# Patient Record
Sex: Female | Born: 1960 | Race: White | Hispanic: No | Marital: Married | Smoking: Never smoker
Health system: Southern US, Community
[De-identification: ages and names within clinical notes are randomized; demographics above are authoritative.]

## PROBLEM LIST (undated history)

## (undated) DIAGNOSIS — T7840XA Allergy, unspecified, initial encounter: Secondary | ICD-10-CM

## (undated) DIAGNOSIS — E079 Disorder of thyroid, unspecified: Secondary | ICD-10-CM

## (undated) DIAGNOSIS — J45909 Unspecified asthma, uncomplicated: Secondary | ICD-10-CM

## (undated) HISTORY — DX: Allergy, unspecified, initial encounter: T78.40XA

## (undated) HISTORY — PX: ABDOMINAL HYSTERECTOMY: SHX81

## (undated) HISTORY — DX: Disorder of thyroid, unspecified: E07.9

## (undated) HISTORY — DX: Unspecified asthma, uncomplicated: J45.909

---

## 1998-02-15 ENCOUNTER — Inpatient Hospital Stay (HOSPITAL_COMMUNITY): Admission: RE | Admit: 1998-02-15 | Discharge: 1998-02-16 | Payer: Self-pay | Admitting: Gynecology

## 1998-08-13 ENCOUNTER — Ambulatory Visit (HOSPITAL_COMMUNITY): Admission: RE | Admit: 1998-08-13 | Discharge: 1998-08-13 | Payer: Self-pay | Admitting: Family Medicine

## 1998-08-13 ENCOUNTER — Encounter: Payer: Self-pay | Admitting: Family Medicine

## 1999-07-26 ENCOUNTER — Other Ambulatory Visit: Admission: RE | Admit: 1999-07-26 | Discharge: 1999-07-26 | Payer: Self-pay | Admitting: Gynecology

## 2000-10-10 ENCOUNTER — Other Ambulatory Visit: Admission: RE | Admit: 2000-10-10 | Discharge: 2000-10-10 | Payer: Self-pay | Admitting: Gynecology

## 2003-01-26 ENCOUNTER — Other Ambulatory Visit: Admission: RE | Admit: 2003-01-26 | Discharge: 2003-01-26 | Payer: Self-pay | Admitting: Gynecology

## 2004-08-08 ENCOUNTER — Other Ambulatory Visit: Admission: RE | Admit: 2004-08-08 | Discharge: 2004-08-08 | Payer: Self-pay | Admitting: Gynecology

## 2016-04-02 ENCOUNTER — Ambulatory Visit (INDEPENDENT_AMBULATORY_CARE_PROVIDER_SITE_OTHER): Payer: Self-pay | Admitting: Emergency Medicine

## 2016-04-02 VITALS — BP 125/85 | HR 92 | Temp 98.6°F | Ht 66.0 in | Wt 225.8 lb

## 2016-04-02 DIAGNOSIS — R059 Cough, unspecified: Secondary | ICD-10-CM

## 2016-04-02 DIAGNOSIS — J209 Acute bronchitis, unspecified: Secondary | ICD-10-CM

## 2016-04-02 DIAGNOSIS — R05 Cough: Secondary | ICD-10-CM

## 2016-04-02 MED ORDER — AZITHROMYCIN 250 MG PO TABS: ORAL_TABLET | ORAL | 0 refills | Status: DC

## 2016-04-02 MED ORDER — PROMETHAZINE-CODEINE 6.25-10 MG/5ML PO SYRP: 5.0000 mL | ORAL_SOLUTION | Freq: Every evening | ORAL | 0 refills | Status: DC | PRN

## 2016-04-02 NOTE — Patient Instructions (Addendum)
     IF you received an x-ray today, you will receive an invoice from [facility]. Please contact [facility] at [phone] with questions or concerns regarding your invoice.   IF you received labwork today, you will receive an invoice from [facility]. Please contact [facility] at [phone] with questions or concerns regarding your invoice.   Our billing staff will not be able to assist you with questions regarding bills from these companies.  You will be contacted with the lab results as soon as they are available. The fastest way to get your results is to activate your My Chart account. Instructions are located on the last page of this paperwork. If you have not heard from us regarding the results in 2 weeks, please contact this office.      Acute Bronchitis, Adult Acute bronchitis is when air tubes (bronchi) in the lungs suddenly get swollen. The condition can make it hard to breathe. It can also cause these symptoms:  A cough.  Coughing up clear, yellow, or green mucus.  Wheezing.  Chest congestion.  Shortness of breath.  A fever.  Body aches.  Chills.  A sore throat.  Follow these instructions at home: Medicines  Take over-the-counter and prescription medicines only as told by your doctor.  If you were prescribed an antibiotic medicine, take it as told by your doctor. Do not stop taking the antibiotic even if you start to feel better. General instructions  Rest.  Drink enough fluids to keep your pee (urine) clear or pale yellow.  Avoid smoking and secondhand smoke. If you smoke and you need help quitting, ask your doctor. Quitting will help your lungs heal faster.  Use an inhaler, cool mist vaporizer, or humidifier as told by your doctor.  Keep all follow-up visits as told by your doctor. This is important. How is this prevented? To lower your risk of getting this condition again:  Wash your hands often with soap and water. If you cannot  use soap and water, use hand sanitizer.  Avoid contact with people who have cold symptoms.  Try not to touch your hands to your mouth, nose, or eyes.  Make sure to get the flu shot every year.  Contact a doctor if:  Your symptoms do not get better in 2 weeks. Get help right away if:  You cough up blood.  You have chest pain.  You have very bad shortness of breath.  You become dehydrated.  You faint (pass out) or keep feeling like you are going to pass out.  You keep throwing up (vomiting).  You have a very bad headache.  Your fever or chills gets worse. This information is not intended to replace advice given to you by your health care provider. Make sure you discuss any questions you have with your health care provider. Document Released: 06/20/2007 Document Revised: 08/10/2015 Document Reviewed: 06/22/2015 Elsevier Interactive Patient Education  2017 Elsevier Inc.  

## 2016-04-02 NOTE — Progress Notes (Signed)
Stacey Black 56 y.o.   Chief Complaint  Patient presents with  . Sinus Problem    X 1 mth   . Cough    X 4 weeks    HISTORY OF PRESENT ILLNESS: This is a 56 y.o. female complaining of 4 week h/o cough now bringing up green phlegm.  Cough  This is a new problem. The current episode started 1 to 4 weeks ago. The problem has been gradually worsening. The problem occurs every few minutes. The cough is productive of purulent sputum. Associated symptoms include myalgias. Pertinent negatives include no chest pain, chills, ear congestion, ear pain, eye redness, fever, headaches, heartburn, hemoptysis, nasal congestion, rash, rhinorrhea, sore throat or shortness of breath. Her past medical history is significant for asthma.     Prior to Admission medications   Not on File    Allergies  Allergen Reactions  . Penicillins Swelling    There are no active problems to display for this patient.   Past Medical History:  Diagnosis Date  . Allergy   . Asthma    as a child  . Thyroid disease     Past Surgical History:  Procedure Laterality Date  . ABDOMINAL HYSTERECTOMY     2000    Social History   Social History  . Marital status: Married    Spouse name: N/A  . Number of children: N/A  . Years of education: N/A   Occupational History  . Not on file.   Social History Main Topics  . Smoking status: Never Smoker  . Smokeless tobacco: Never Used  . Alcohol use 1.8 oz/week    1 Glasses of wine, 1 Cans of beer, 1 Shots of liquor per week  . Drug use: No  . Sexual activity: Not on file   Other Topics Concern  . Not on file   Social History Narrative  . No narrative on file    Family History  Problem Relation Age of Onset  . Arthritis Mother   . Asthma Mother   . Vision loss Mother      Review of Systems  Constitutional: Positive for malaise/fatigue. Negative for chills and fever.  HENT: Positive for congestion. Negative for ear discharge, ear pain,  nosebleeds, rhinorrhea and sore throat.   Eyes: Negative for discharge and redness.  Respiratory: Positive for cough. Negative for hemoptysis and shortness of breath.   Cardiovascular: Negative for chest pain, palpitations and leg swelling.  Gastrointestinal: Negative for abdominal pain, diarrhea, heartburn, nausea and vomiting.  Genitourinary: Negative for dysuria and hematuria.  Musculoskeletal: Positive for myalgias. Negative for back pain and neck pain.  Skin: Negative for rash.  Neurological: Negative for dizziness, sensory change, focal weakness and headaches.  Endo/Heme/Allergies: Negative.   All other systems reviewed and are negative.  Vitals:   04/02/16 1527  BP: 125/85  Pulse: 92  Temp: 98.6 F (37 C)     Physical Exam  Constitutional: She is oriented to person, place, and time. She appears well-developed and well-nourished.  HENT:  Head: Normocephalic and atraumatic.  Right Ear: External ear normal.  Left Ear: External ear normal.  Nose: Nose normal.  Mouth/Throat: Oropharynx is clear and moist.  Eyes: Conjunctivae and EOM are normal. Pupils are equal, round, and reactive to light.  Neck: Normal range of motion. No JVD present. No thyromegaly present.  Cardiovascular: Normal rate, regular rhythm and normal heart sounds.   No murmur heard. Pulmonary/Chest: Effort normal and breath sounds normal. She has no  wheezes. She has no rales.  Abdominal: Soft. She exhibits no distension. There is no tenderness.  Musculoskeletal: Normal range of motion.  Lymphadenopathy:    She has no cervical adenopathy.  Neurological: She is alert and oriented to person, place, and time. No sensory deficit. She exhibits normal muscle tone.  Skin: Skin is warm and dry. Capillary refill takes less than 2 seconds. No rash noted.  Psychiatric: She has a normal mood and affect.  Vitals reviewed.    ASSESSMENT & PLAN: Stacey Black was seen today for sinus problem and cough.  Diagnoses and all  orders for this visit:  Acute bronchitis, unspecified organism  Cough  Other orders -     azithromycin (ZITHROMAX) 250 MG tablet; Sig as indicated -     promethazine-codeine (PHENERGAN WITH CODEINE) 6.25-10 MG/5ML syrup; Take 5 mLs by mouth at bedtime as needed for cough.    Patient Instructions       IF you received an x-ray today, you will receive an invoice from [facility]. Please contact [facility] at [phone] with questions or concerns regarding your invoice.   IF you received labwork today, you will receive an invoice from [facility]. Please contact [facility] at [phone] with questions or concerns regarding your invoice.   Our billing staff will not be able to assist you with questions regarding bills from these companies.  You will be contacted with the lab results as soon as they are available. The fastest way to get your results is to activate your My Chart account. Instructions are located on the last page of this paperwork. If you have not heard from [place] regarding the results in 2 weeks, please contact this office.      Acute Bronchitis, Adult Acute bronchitis is when air tubes (bronchi) in the lungs suddenly get swollen. The condition can make it hard to breathe. It can also cause these symptoms:  A cough.  Coughing up clear, yellow, or green mucus.  Wheezing.  Chest congestion.  Shortness of breath.  A fever.  Body aches.  Chills.  A sore throat. Follow these instructions at home: Medicines   Take over-the-counter and prescription medicines only as told by your doctor.  If you were prescribed an antibiotic medicine, take it as told by your doctor. Do not stop taking the antibiotic even if you start to feel better. General instructions   Rest.  Drink enough fluids to keep your pee (urine) clear or pale yellow.  Avoid smoking and secondhand smoke. If you smoke and you need help quitting, ask your doctor. Quitting will  help your lungs heal faster.  Use an inhaler, cool mist vaporizer, or humidifier as told by your doctor.  Keep all follow-up visits as told by your doctor. This is important. How is this prevented? To lower your risk of getting this condition again:  Wash your hands often with soap and water. If you cannot use soap and water, use hand sanitizer.  Avoid contact with people who have cold symptoms.  Try not to touch your hands to your mouth, nose, or eyes.  Make sure to get the flu shot every year. Contact a doctor if:  Your symptoms do not get better in 2 weeks. Get help right away if:  You cough up blood.  You have chest pain.  You have very bad shortness of breath.  You become dehydrated.  You faint (pass out) or keep feeling like you are going to pass out.  You keep throwing up (vomiting).  You have a very bad headache.  Your fever or chills gets worse. This information is not intended to replace advice given to you by your health care provider. Make sure you discuss any questions you have with your health care provider. Document Released: 06/20/2007 Document Revised: 08/10/2015 Document Reviewed: 06/22/2015 Elsevier Interactive Patient Education  2017 Elsevier Inc.      Edwina BarthMiguel Deanndra Kirley, MD [facility]

## 2017-10-04 ENCOUNTER — Ambulatory Visit: Payer: Self-pay | Admitting: Family Medicine

## 2017-10-04 VITALS — BP 110/83 | HR 83 | Temp 98.5°F | Ht 66.0 in | Wt 220.0 lb

## 2017-10-04 DIAGNOSIS — J0110 Acute frontal sinusitis, unspecified: Secondary | ICD-10-CM

## 2017-10-04 DIAGNOSIS — R059 Cough, unspecified: Secondary | ICD-10-CM

## 2017-10-04 DIAGNOSIS — R062 Wheezing: Secondary | ICD-10-CM

## 2017-10-04 DIAGNOSIS — R05 Cough: Secondary | ICD-10-CM

## 2017-10-04 MED ORDER — PREDNISONE 20 MG PO TABS: 40.0000 mg | ORAL_TABLET | Freq: Every day | ORAL | 0 refills | Status: AC

## 2017-10-04 MED ORDER — AZITHROMYCIN 500 MG PO TABS: 500.0000 mg | ORAL_TABLET | Freq: Every day | ORAL | 0 refills | Status: AC

## 2017-10-04 MED ORDER — PSEUDOEPH-BROMPHEN-DM 30-2-10 MG/5ML PO SYRP: 10.0000 mL | ORAL_SOLUTION | Freq: Three times a day (TID) | ORAL | 0 refills | Status: DC | PRN

## 2017-10-04 NOTE — Patient Instructions (Signed)

## 2017-10-04 NOTE — Progress Notes (Signed)
Stacey NearingJane Black Sherrill is a 57 y.o. female who presents today with concerns of cough and sinus congestion for the last 2 weeks. She has not attempted to treat this condition with any medications up to this point. She reports that she is under a lot of stress related to the planning of her daughters wedding.  Review of Systems  Constitutional: Negative for chills, fever and malaise/fatigue.  HENT: Positive for congestion and sore throat. Negative for ear discharge, ear pain and sinus pain.   Eyes: Negative.   Respiratory: Positive for cough and sputum production. Negative for shortness of breath.   Cardiovascular: Negative.  Negative for chest pain.  Gastrointestinal: Negative for abdominal pain, diarrhea, nausea and vomiting.  Genitourinary: Negative for dysuria, frequency, hematuria and urgency.  Musculoskeletal: Negative for myalgias.  Skin: Negative.   Neurological: Negative for headaches.  Endo/Heme/Allergies: Negative.   Psychiatric/Behavioral: Negative.     O: Vitals:   10/04/17 1329  BP: 110/83  Pulse: 83  Temp: 98.5 F (36.9 C)  SpO2: 96%     Physical Exam  Constitutional: She is oriented to person, place, and time. Vital signs are normal. She appears well-developed and well-nourished. She is active.  Non-toxic appearance. She does not have a sickly appearance.  HENT:  Head: Normocephalic.  Right Ear: Hearing, tympanic membrane, external ear and ear canal normal.  Left Ear: Hearing, tympanic membrane, external ear and ear canal normal.  Nose: Rhinorrhea present. Right sinus exhibits frontal sinus tenderness. Left sinus exhibits frontal sinus tenderness.  Mouth/Throat: Uvula is midline. Posterior oropharyngeal erythema present.  Neck: Normal range of motion. Neck supple.  Cardiovascular: Normal rate, regular rhythm, normal heart sounds and normal pulses.  Pulmonary/Chest: Effort normal. She has wheezes in the right upper field, the right middle field, the right lower field, the  left upper field, the left middle field and the left lower field. She has rhonchi in the right lower field and the left lower field.  Abdominal: Soft. Bowel sounds are normal.  Musculoskeletal: Normal range of motion.  Lymphadenopathy:       Head (right side): No submental and no submandibular adenopathy present.       Head (left side): No submental and no submandibular adenopathy present.    She has no cervical adenopathy.  Neurological: She is alert and oriented to person, place, and time.  Psychiatric: She has a normal mood and affect.  Vitals reviewed.  A: 1. Acute non-recurrent frontal sinusitis   2. Cough   3. Wheezing    P: Discussed exam findings, diagnosis etiology and medication use and indications reviewed with patient. Follow- Up and discharge instructions provided. No emergent/urgent issues found on exam.  Patient verbalized understanding of information provided and agrees with plan of care (POC), all questions answered.  1. Acute non-recurrent frontal sinusitis - azithromycin (ZITHROMAX) 500 MG tablet; Take 1 tablet (500 mg total) by mouth daily for 3 days.  2. Cough - brompheniramine-pseudoephedrine-DM 30-2-10 MG/5ML syrup; Take 10 mLs by mouth 3 (three) times daily as needed (increase fluid(water) intake).  3. Wheezing - predniSONE (DELTASONE) 20 MG tablet; Take 2 tablets (40 mg total) by mouth daily with breakfast for 5 days.

## 2017-10-05 ENCOUNTER — Ambulatory Visit: Payer: Self-pay | Admitting: Osteopathic Medicine

## 2018-09-09 ENCOUNTER — Encounter: Payer: Self-pay | Admitting: Orthopaedic Surgery

## 2018-09-09 ENCOUNTER — Ambulatory Visit (INDEPENDENT_AMBULATORY_CARE_PROVIDER_SITE_OTHER): Payer: BC Managed Care – PPO | Admitting: Orthopaedic Surgery

## 2018-09-09 ENCOUNTER — Ambulatory Visit (INDEPENDENT_AMBULATORY_CARE_PROVIDER_SITE_OTHER): Payer: Self-pay

## 2018-09-09 DIAGNOSIS — M25562 Pain in left knee: Secondary | ICD-10-CM

## 2018-09-09 MED ORDER — LIDOCAINE HCL 1 % IJ SOLN
3.0000 mL | INTRAMUSCULAR | Status: AC | PRN
  Administered 2018-09-09: 3 mL

## 2018-09-09 MED ORDER — METHYLPREDNISOLONE ACETATE 40 MG/ML IJ SUSP
40.0000 mg | INTRAMUSCULAR | Status: AC | PRN
  Administered 2018-09-09: 40 mg via INTRA_ARTICULAR

## 2018-09-09 NOTE — Progress Notes (Signed)
Office Visit Note   Patient: Stacey Black           Date of Birth: 1960/07/01           MRN: [id number] Visit Date: 09/09/2018              Requested by: No referring provider defined for this encounter. PCP: Patient, No Pcp Per   Assessment & Plan: Visit Diagnoses:  1. Acute pain of left knee     Plan: She will perform quad strengthening exercises as shown.  Did discuss with her that if her pain persist or she develops any mechanical symptoms then she will likely need an MRI to rule out meniscal tear.  Questions encouraged and answered at length by Dr. Ninfa Linden self.  Per patient's wishes she will follow-up on an as-needed basis.  Follow-Up Instructions: Return if symptoms worsen or fail to improve.   Orders:  Orders Placed This Encounter  Procedures  . Large Joint Inj  . XR Knee 1-2 Views Left   No orders of the defined types were placed in this encounter.     Procedures: Large Joint Inj: L knee on 09/09/2018 11:51 AM Indications: pain Details: 22 G 1.5 in needle, anterolateral approach  Arthrogram: No  Medications: 3 mL lidocaine 1 %; 40 mg methylPREDNISolone acetate 40 MG/ML Aspirate: 7 mL blood-tinged Outcome: tolerated well, no immediate complications Procedure, treatment alternatives, risks and benefits explained, specific risks discussed. Consent was given by the patient. Immediately prior to procedure a time out was called to verify the correct patient, procedure, equipment, support staff and site/side marked as required. Patient was prepped and draped in the usual sterile fashion.       Clinical Data: No additional findings.   Subjective: Chief Complaint  Patient presents with  . Left Knee - Pain    HPI Stacey Black is a 58 year old female comes in today with left knee pain that began in June when she was at the beach in the house that she was staying and had 36 steps in it and since that time she has had pain in the knee.  She had no prior pain  in the knee.  She describes no mechanical symptoms of the knee.  Notes some swelling in the knee she is tried ice and heat ibuprofen which will help some.  No known injury to the knee.  She denies any pain in regards to the right knee. Review of Systems Negative for fevers chills shortness of breath chest pain see HPI.  Objective: Vital Signs: There were no vitals taken for this visit.  Physical Exam Constitutional:      Appearance: She is not ill-appearing or diaphoretic.  Pulmonary:     Effort: Pulmonary effort is normal.  Neurological:     Mental Status: She is alert and oriented to person, place, and time.  Psychiatric:        Mood and Affect: Mood normal.     Ortho Exam Bilateral knees good range of motion.  Right knee nontender throughout left knee tenderness along lateral joint line peripatellar region.  No abnormal warmth erythema of either knee.  Left knee with slight effusion.  No instability valgus varus stressing of either knee.  McMurray's positive on the left negative on the right.  Osmond Clark test positive on the left negative on the right. Specialty Comments:  No specialty comments available.  Imaging: Xr Knee 1-2 Views Left  Result Date: 09/09/2018 Left knee AP lateral views: No  acute fracture knee is well located.  Significant patellofemoral arthritic changes with large osteophytes.  Medial lateral joint line of the left knee is well maintained.  The right knee on the AP view does show moderate narrowing medial joint line.  No other bony abnormalities.    PMFS History: Patient Active Problem List   Diagnosis Date Noted  . Acute bronchitis 04/02/2016  . Cough 04/02/2016   Past Medical History:  Diagnosis Date  . Allergy   . Asthma    as a child  . Thyroid disease     Family History  Problem Relation Age of Onset  . Arthritis Mother   . Asthma Mother   . Vision loss Mother     Past Surgical History:  Procedure Laterality Date  . ABDOMINAL  HYSTERECTOMY     2000   Social History   Occupational History  . Not on file  Tobacco Use  . Smoking status: Never Smoker  . Smokeless tobacco: Never Used  Substance and Sexual Activity  . Alcohol use: Yes    Alcohol/week: 3.0 standard drinks    Types: 1 Glasses of wine, 1 Cans of beer, 1 Shots of liquor per week  . Drug use: No  . Sexual activity: Not on file

## 2018-09-16 ENCOUNTER — Ambulatory Visit (HOSPITAL_COMMUNITY)
Admission: EM | Admit: 2018-09-16 | Discharge: 2018-09-16 | Disposition: A | Discharge status: Home / Self Care | Payer: Self-pay | Attending: Emergency Medicine | Admitting: Emergency Medicine

## 2018-09-16 ENCOUNTER — Other Ambulatory Visit: Payer: Self-pay

## 2018-09-16 DIAGNOSIS — M7918 Myalgia, other site: Secondary | ICD-10-CM

## 2018-09-16 MED ORDER — IBUPROFEN 600 MG PO TABS: 600.0000 mg | ORAL_TABLET | Freq: Four times a day (QID) | ORAL | 0 refills | Status: AC | PRN

## 2018-09-16 MED ORDER — CYCLOBENZAPRINE HCL 10 MG PO TABS: 10.0000 mg | ORAL_TABLET | Freq: Two times a day (BID) | ORAL | 0 refills | Status: DC | PRN

## 2018-09-16 NOTE — Discharge Instructions (Signed)
Take the ibuprofen as needed for your discomfort.  Take the muscle relaxer Flexeril as needed for muscle spasm; do not drive, operate machinery, or drink alcohol with this medication as it may make you drowsy.    Go to the emergency department if you have dizziness, weakness, chest pain, shortness of breath, abdominal pain, or other concerning symptoms.
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
## 2018-09-23 ENCOUNTER — Ambulatory Visit (HOSPITAL_COMMUNITY)
Admission: EM | Admit: 2018-09-23 | Discharge: 2018-09-23 | Disposition: A | Discharge status: Home / Self Care | Payer: Self-pay | Attending: Family Medicine | Admitting: Family Medicine

## 2018-09-23 ENCOUNTER — Encounter (HOSPITAL_COMMUNITY): Payer: Self-pay | Admitting: Emergency Medicine

## 2018-09-23 ENCOUNTER — Other Ambulatory Visit: Payer: Self-pay

## 2018-09-23 DIAGNOSIS — M62838 Other muscle spasm: Secondary | ICD-10-CM

## 2018-09-23 DIAGNOSIS — R030 Elevated blood-pressure reading, without diagnosis of hypertension: Secondary | ICD-10-CM

## 2018-09-23 DIAGNOSIS — G44209 Tension-type headache, unspecified, not intractable: Secondary | ICD-10-CM

## 2018-09-23 DIAGNOSIS — M62830 Muscle spasm of back: Secondary | ICD-10-CM

## 2018-09-23 MED ORDER — CYCLOBENZAPRINE HCL 10 MG PO TABS: 10.0000 mg | ORAL_TABLET | Freq: Two times a day (BID) | ORAL | 0 refills | Status: DC | PRN
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
## 2018-09-23 NOTE — ED Notes (Signed)
Bed: UC09 Expected date:  Expected time:  Means of arrival:  Comments: appointment

## 2018-09-23 NOTE — Discharge Instructions (Addendum)
Continue taking ibuprofen with food as needed.
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
## 2018-10-02 ENCOUNTER — Other Ambulatory Visit: Payer: Self-pay

## 2018-10-02 ENCOUNTER — Encounter (HOSPITAL_COMMUNITY): Payer: Self-pay | Admitting: Family Medicine

## 2018-10-02 ENCOUNTER — Ambulatory Visit (HOSPITAL_COMMUNITY)
Admission: EM | Admit: 2018-10-02 | Discharge: 2018-10-02 | Disposition: A | Discharge status: Home / Self Care | Payer: Self-pay | Attending: Family Medicine | Admitting: Family Medicine

## 2018-10-02 DIAGNOSIS — S161XXD Strain of muscle, fascia and tendon at neck level, subsequent encounter: Secondary | ICD-10-CM

## 2018-10-02 MED ORDER — PREDNISONE 20 MG PO TABS: ORAL_TABLET | ORAL | 1 refills | Status: DC
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
## 2018-10-21 ENCOUNTER — Ambulatory Visit (INDEPENDENT_AMBULATORY_CARE_PROVIDER_SITE_OTHER): Payer: BC Managed Care – PPO

## 2018-10-21 ENCOUNTER — Other Ambulatory Visit: Payer: Self-pay

## 2018-10-21 ENCOUNTER — Encounter: Payer: Self-pay | Admitting: Orthopaedic Surgery

## 2018-10-21 ENCOUNTER — Ambulatory Visit (INDEPENDENT_AMBULATORY_CARE_PROVIDER_SITE_OTHER): Payer: BC Managed Care – PPO | Admitting: Orthopaedic Surgery

## 2018-10-21 DIAGNOSIS — M549 Dorsalgia, unspecified: Secondary | ICD-10-CM

## 2018-10-21 DIAGNOSIS — M542 Cervicalgia: Secondary | ICD-10-CM

## 2018-10-21 DIAGNOSIS — M545 Low back pain: Secondary | ICD-10-CM | POA: Diagnosis not present

## 2018-10-21 DIAGNOSIS — G8929 Other chronic pain: Secondary | ICD-10-CM
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
## 2018-11-18 ENCOUNTER — Telehealth: Payer: Self-pay | Admitting: *Deleted
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
## 2018-11-19 NOTE — Telephone Encounter (Signed)
I am fine with [place] attempting to obtain an MRI of her left knee to rule out ligamentous damage.

## 2018-11-19 NOTE — Telephone Encounter (Signed)
Please advise.  Patient was last seen for left knee in august with an injection.

## 2018-11-20 ENCOUNTER — Other Ambulatory Visit: Payer: Self-pay | Admitting: Radiology

## 2018-11-20 DIAGNOSIS — M25562 Pain in left knee: Secondary | ICD-10-CM

## 2018-11-20 NOTE — Telephone Encounter (Signed)
Ordered

## 2018-12-01 ENCOUNTER — Other Ambulatory Visit: Payer: Self-pay

## 2018-12-01 DIAGNOSIS — Z20822 Contact with and (suspected) exposure to covid-19: Secondary | ICD-10-CM

## 2018-12-03 LAB — NOVEL CORONAVIRUS, NAA: SARS-CoV-2, NAA: NOT DETECTED

## 2018-12-14 ENCOUNTER — Ambulatory Visit
Admission: RE | Admit: 2018-12-14 | Discharge: 2018-12-14 | Disposition: A | Discharge status: Home / Self Care | Payer: BC Managed Care – PPO | Source: Ambulatory Visit | Attending: Orthopaedic Surgery | Admitting: Orthopaedic Surgery

## 2018-12-14 ENCOUNTER — Other Ambulatory Visit: Payer: Self-pay

## 2018-12-14 DIAGNOSIS — M50223 Other cervical disc displacement at C6-C7 level: Secondary | ICD-10-CM | POA: Diagnosis not present

## 2018-12-14 DIAGNOSIS — M25562 Pain in left knee: Secondary | ICD-10-CM | POA: Diagnosis not present

## 2018-12-14 DIAGNOSIS — M542 Cervicalgia: Secondary | ICD-10-CM

## 2018-12-17 ENCOUNTER — Other Ambulatory Visit: Payer: Self-pay

## 2018-12-17 ENCOUNTER — Telehealth: Payer: Self-pay

## 2018-12-17 ENCOUNTER — Encounter: Payer: Self-pay | Admitting: Orthopaedic Surgery

## 2018-12-17 ENCOUNTER — Ambulatory Visit: Payer: BC Managed Care – PPO | Admitting: Orthopaedic Surgery

## 2018-12-17 DIAGNOSIS — M1712 Unilateral primary osteoarthritis, left knee: Secondary | ICD-10-CM | POA: Diagnosis not present

## 2018-12-17 DIAGNOSIS — M542 Cervicalgia: Secondary | ICD-10-CM
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
## 2018-12-17 NOTE — Telephone Encounter (Signed)
Left Knee gel injection  

## 2018-12-22 ENCOUNTER — Telehealth: Payer: Self-pay

## 2018-12-22 NOTE — Telephone Encounter (Signed)
Noted  

## 2018-12-22 NOTE — Telephone Encounter (Signed)
Submitted VOB for SynviscOne, left knee. 

## 2018-12-24 ENCOUNTER — Encounter: Payer: Self-pay | Admitting: Emergency Medicine

## 2018-12-24 ENCOUNTER — Other Ambulatory Visit: Payer: Self-pay

## 2018-12-24 ENCOUNTER — Ambulatory Visit: Payer: BC Managed Care – PPO | Admitting: Emergency Medicine

## 2018-12-24 ENCOUNTER — Telehealth: Payer: Self-pay | Admitting: *Deleted

## 2018-12-24 VITALS — BP 138/85 | HR 75 | Temp 98.2°F | Resp 16 | Ht 66.0 in | Wt 236.0 lb

## 2018-12-24 DIAGNOSIS — Z13220 Encounter for screening for lipoid disorders: Secondary | ICD-10-CM

## 2018-12-24 DIAGNOSIS — E039 Hypothyroidism, unspecified: Secondary | ICD-10-CM | POA: Diagnosis not present

## 2018-12-24 DIAGNOSIS — Z1321 Encounter for screening for nutritional disorder: Secondary | ICD-10-CM | POA: Diagnosis not present

## 2018-12-24 DIAGNOSIS — Z13228 Encounter for screening for other metabolic disorders: Secondary | ICD-10-CM

## 2018-12-24 DIAGNOSIS — Z8739 Personal history of other diseases of the musculoskeletal system and connective tissue: Secondary | ICD-10-CM | POA: Diagnosis not present

## 2018-12-24 DIAGNOSIS — Z1329 Encounter for screening for other suspected endocrine disorder: Secondary | ICD-10-CM | POA: Diagnosis not present

## 2018-12-24 DIAGNOSIS — Z130 Encounter for screening for diseases of the blood and blood-forming organs and certain disorders involving the immune mechanism: Secondary | ICD-10-CM | POA: Diagnosis not present

## 2018-12-24 DIAGNOSIS — Z1211 Encounter for screening for malignant neoplasm of colon: Secondary | ICD-10-CM

## 2018-12-24 DIAGNOSIS — Z7689 Persons encountering health services in other specified circumstances: Secondary | ICD-10-CM

## 2018-12-24 DIAGNOSIS — Z23 Encounter for immunization: Secondary | ICD-10-CM

## 2018-12-24 MED ORDER — LEVOTHYROXINE SODIUM 100 MCG PO TABS: 100.0000 ug | ORAL_TABLET | Freq: Every day | ORAL | 3 refills | Status: DC

## 2018-12-24 NOTE — Patient Instructions (Addendum)
   If you have lab work done today you will be contacted with your lab results within the next 2 weeks.  If you have not heard from us then please contact us. The fastest way to get your results is to register for My Chart.   IF you received an x-ray today, you will receive an invoice from [facility]. Please contact [facility] at [phone] with questions or concerns regarding your invoice.   IF you received labwork today, you will receive an invoice from [facility]. Please contact [facility] at [phone] with questions or concerns regarding your invoice.   Our billing staff will not be able to assist you with questions regarding bills from these companies.  You will be contacted with the lab results as soon as they are available. The fastest way to get your results is to activate your My Chart account. Instructions are located on the last page of this paperwork. If you have not heard from us regarding the results in 2 weeks, please contact this office.    We recommend that you schedule a mammogram for breast cancer screening. Typically, you do not need a referral to do this. Please contact a local imaging center to schedule your mammogram.  [facility] - [phone]  *ask for the Radiology Department [facility]) - [phone] or [phone]  [facility] - [phone] [facility] - [phone] [facility] - [phone]  *ask for the Radiology Department [facility] - [phone]  *ask for the Radiology Department [facility] - [phone]  *ask for the Mammography Department [facility] - [phone]  Health Maintenance, Female Adopting a healthy lifestyle and getting preventive care are important in promoting health and wellness. Ask your health care provider about:  The right schedule for you to have regular tests and exams.  Things you  can do on your own to prevent diseases and keep yourself healthy. What should I know about diet, weight, and exercise? Eat a healthy diet   Eat a diet that includes plenty of vegetables, fruits, low-fat dairy products, and lean protein.  Do not eat a lot of foods that are high in solid fats, added sugars, or sodium. Maintain a healthy weight Body mass index (BMI) is used to identify weight problems. It estimates body fat based on height and weight. Your health care provider can help determine your BMI and help you achieve or maintain a healthy weight. Get regular exercise Get regular exercise. This is one of the most important things you can do for your health. Most adults should:  Exercise for at least 150 minutes each week. The exercise should increase your heart rate and make you sweat (moderate-intensity exercise).  Do strengthening exercises at least twice a week. This is in addition to the moderate-intensity exercise.  Spend less time sitting. Even light physical activity can be beneficial. Watch cholesterol and blood lipids Have your blood tested for lipids and cholesterol at 60 years of age, then have this test every 5 years. Have your cholesterol levels checked more often if:  Your lipid or cholesterol levels are high.  You are older than 60 years of age.  You are at high risk for heart disease. What should I know about cancer screening? Depending on your health history and family history, you may need to have cancer screening at various ages. This may include screening for:  Breast cancer.  Cervical cancer.    Colorectal cancer.  Skin cancer.  Lung cancer. What should I know about heart disease, diabetes, and high blood pressure? Blood pressure and heart disease  High blood pressure causes heart disease and increases the risk of stroke. This is more likely to develop in people who have high blood pressure readings, are of African descent, or are overweight.  Have your  blood pressure checked: ? Every 3-5 years if you are 62-73 years of age. ? Every year if you are 60 years old or older. Diabetes Have regular diabetes screenings. This checks your fasting blood sugar level. Have the screening done:  Once every three years after age 48 if you are at a normal weight and have a low risk for diabetes.  More often and at a younger age if you are overweight or have a high risk for diabetes. What should I know about preventing infection? Hepatitis B If you have a higher risk for hepatitis B, you should be screened for this virus. Talk with your health care provider to find out if you are at risk for hepatitis B infection. Hepatitis C Testing is recommended for:  Everyone born from [birth year] through 1967.  Anyone with known risk factors for hepatitis C. Sexually transmitted infections (STIs)  Get screened for STIs, including gonorrhea and chlamydia, if: ? You are sexually active and are younger than 60 years of age. ? You are older than 60 years of age and your health care provider tells you that you are at risk for this type of infection. ? Your sexual activity has changed since you were last screened, and you are at increased risk for chlamydia or gonorrhea. Ask your health care provider if you are at risk.  Ask your health care provider about whether you are at high risk for HIV. Your health care provider may recommend a prescription medicine to help prevent HIV infection. If you choose to take medicine to prevent HIV, you should first get tested for HIV. You should then be tested every 3 months for as long as you are taking the medicine. Pregnancy  If you are about to stop having your period (premenopausal) and you may become pregnant, seek counseling before you get pregnant.  Take 400 to 800 micrograms (mcg) of folic acid every day if you become pregnant.  Ask for birth control (contraception) if you want to prevent pregnancy. Osteoporosis and  menopause Osteoporosis is a disease in which the bones lose minerals and strength with aging. This can result in bone fractures. If you are 51 years old or older, or if you are at risk for osteoporosis and fractures, ask your health care provider if you should:  Be screened for bone loss.  Take a calcium or vitamin D supplement to lower your risk of fractures.  Be given hormone replacement therapy (HRT) to treat symptoms of menopause. Follow these instructions at home: Lifestyle  Do not use any products that contain nicotine or tobacco, such as cigarettes, e-cigarettes, and chewing tobacco. If you need help quitting, ask your health care provider.  Do not use street drugs.  Do not share needles.  Ask your health care provider for help if you need support or information about quitting drugs. Alcohol use  Do not drink alcohol if: ? Your health care provider tells you not to drink. ? You are pregnant, may be pregnant, or are planning to become pregnant.  If you drink alcohol: ? Limit how much you use to 0-1 drink a day. ? Limit  intake if you are breastfeeding.  Be aware of how much alcohol is in your drink. In the [place] one drink equals one 12 oz bottle of beer (355 mL), one 5 oz glass of wine (148 mL), or one 1 oz glass of hard liquor (44 mL). General instructions  Schedule regular health, dental, and eye exams.  Stay current with your vaccines.  Tell your health care provider if: ? You often feel depressed. ? You have ever been abused or do not feel safe at home. Summary  Adopting a healthy lifestyle and getting preventive care are important in promoting health and wellness.  Follow your health care provider's instructions about healthy diet, exercising, and getting tested or screened for diseases.  Follow your health care provider's instructions on monitoring your cholesterol and blood pressure. This information is not intended to replace advice given to you by your  health care provider. Make sure you discuss any questions you have with your health care provider. Document Released: 07/17/2010 Document Revised: 12/25/2017 Document Reviewed: 12/25/2017 Elsevier Patient Education  2020 [employer].
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
## 2018-12-24 NOTE — Telephone Encounter (Signed)
Faxed Cologuard requisition to [facility]. Confirmation page 5:19 pm.

## 2018-12-25 ENCOUNTER — Encounter: Payer: Self-pay | Admitting: Emergency Medicine

## 2018-12-25 LAB — COMPREHENSIVE METABOLIC PANEL
ALT: 23 IU/L (ref 0–32)
AST: 21 IU/L (ref 0–40)
Albumin/Globulin Ratio: 1.5 (ref 1.2–2.2)
Albumin: 4.6 g/dL (ref 3.8–4.9)
Alkaline Phosphatase: 98 IU/L (ref 39–117)
BUN/Creatinine Ratio: 28 (ref 12–28)
BUN: 22 mg/dL (ref 8–27)
Bilirubin Total: 0.4 mg/dL (ref 0.0–1.2)
CO2: 22 mmol/L (ref 20–29)
Calcium: 10.2 mg/dL (ref 8.7–10.3)
Chloride: 104 mmol/L (ref 96–106)
Creatinine, Ser: 0.8 mg/dL (ref 0.57–1.00)
GFR calc Af Amer: 93 mL/min/{1.73_m2} (ref >59)
GFR calc non Af Amer: 80 mL/min/{1.73_m2} (ref >59)
Globulin, Total: 3 g/dL (ref 1.5–4.5)
Glucose: 88 mg/dL (ref 65–99)
Potassium: 4.3 mmol/L (ref 3.5–5.2)
Sodium: 141 mmol/L (ref 134–144)
Total Protein: 7.6 g/dL (ref 6.0–8.5)

## 2018-12-25 LAB — CBC WITH DIFFERENTIAL/PLATELET
Basophils Absolute: 0.1 10*3/uL (ref 0.0–0.2)
Basos: 1 %
EOS (ABSOLUTE): 0.2 10*3/uL (ref 0.0–0.4)
Eos: 3 %
Hematocrit: 47.3 % — ABNORMAL HIGH (ref 34.0–46.6)
Hemoglobin: 15.6 g/dL (ref 11.1–15.9)
Immature Grans (Abs): 0 10*3/uL (ref 0.0–0.1)
Immature Granulocytes: 0 %
Lymphocytes Absolute: 2 10*3/uL (ref 0.7–3.1)
Lymphs: 25 %
MCH: 29.4 pg (ref 26.6–33.0)
MCHC: 33 g/dL (ref 31.5–35.7)
MCV: 89 fL (ref 79–97)
Monocytes Absolute: 0.5 10*3/uL (ref 0.1–0.9)
Monocytes: 6 %
Neutrophils Absolute: 5.2 10*3/uL (ref 1.4–7.0)
Neutrophils: 65 %
Platelets: 269 10*3/uL (ref 150–450)
RBC: 5.3 x10E6/uL — ABNORMAL HIGH (ref 3.77–5.28)
RDW: 13.1 % (ref 11.7–15.4)
WBC: 8 10*3/uL (ref 3.4–10.8)

## 2018-12-25 LAB — THYROID PANEL WITH TSH
Free Thyroxine Index: 1.9 (ref 1.2–4.9)
T3 Uptake Ratio: 25 % (ref 24–39)
T4, Total: 7.6 ug/dL (ref 4.5–12.0)
TSH: 4.9 u[IU]/mL — ABNORMAL HIGH (ref 0.450–4.500)

## 2018-12-25 LAB — LIPID PANEL
Chol/HDL Ratio: 3.2 ratio (ref 0.0–4.4)
Cholesterol, Total: 188 mg/dL (ref 100–199)
HDL: 58 mg/dL (ref >39)
LDL Chol Calc (NIH): 110 mg/dL — ABNORMAL HIGH (ref 0–99)
Triglycerides: 111 mg/dL (ref 0–149)
VLDL Cholesterol Cal: 20 mg/dL (ref 5–40)

## 2018-12-25 LAB — HEMOGLOBIN A1C
Est. average glucose Bld gHb Est-mCnc: 103 mg/dL
Hgb A1c MFr Bld: 5.2 % (ref 4.8–5.6)

## 2018-12-29 ENCOUNTER — Telehealth: Payer: Self-pay

## 2018-12-29 NOTE — Telephone Encounter (Signed)
PA required for SynviscOne, left knee. Faxed completed PA form to [employer] at [phone]. 

## 2018-12-30 ENCOUNTER — Encounter: Payer: Self-pay | Admitting: Physical Therapy

## 2018-12-30 ENCOUNTER — Other Ambulatory Visit: Payer: Self-pay

## 2018-12-30 ENCOUNTER — Ambulatory Visit (INDEPENDENT_AMBULATORY_CARE_PROVIDER_SITE_OTHER): Payer: BC Managed Care – PPO | Admitting: Physical Therapy

## 2018-12-30 DIAGNOSIS — M6281 Muscle weakness (generalized): Secondary | ICD-10-CM | POA: Diagnosis not present

## 2018-12-30 DIAGNOSIS — R293 Abnormal posture: Secondary | ICD-10-CM

## 2018-12-30 DIAGNOSIS — M25562 Pain in left knee: Secondary | ICD-10-CM | POA: Diagnosis not present

## 2018-12-30 DIAGNOSIS — M542 Cervicalgia: Secondary | ICD-10-CM | POA: Diagnosis not present

## 2018-12-30 DIAGNOSIS — R29898 Other symptoms and signs involving the musculoskeletal system: Secondary | ICD-10-CM

## 2018-12-30 NOTE — Patient Instructions (Signed)
Access Code: [email or web address]  URL: [email or web address]  Date: 12/30/2018  Prepared by: Faustino Congress   Exercises Seated Scapular Retraction - 10 reps - 1 sets - 5 sec hold - 2x daily - 7x weekly Doorway Pec Stretch at 90 Degrees Abduction - 3 reps - 1 sets - 30 sec hold - 1x daily - 7x weekly Standing Backward Shoulder Rolls - 10 reps - 1 sets - 2x daily - 7x weekly Supine Bridge - 10 reps - 1 sets - 5 sec hold - 2x daily - 7x weekly Sidelying Hip Abduction - 1 sets - 10 reps - 2x daily - 7x weekly Seated Long Arc Quad - 10 reps - 1 sets - 2x daily - 7x weekly Patient Education Trigger Point Dry Needling
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
## 2018-12-31 ENCOUNTER — Telehealth: Payer: Self-pay

## 2018-12-31 NOTE — Telephone Encounter (Signed)
Approved for SynviscOne , left knee. Buy & Bill Covered at 100% after Co-pay Co-pay of $100.00 required PA required PA Approval# [id number] Valid 12/29/2018- 12/29/2019   Appt. 01/14/2019 with Dr. Ninfa Linden

## 2019-01-14 ENCOUNTER — Ambulatory Visit: Payer: BC Managed Care – PPO | Admitting: Orthopaedic Surgery

## 2019-01-14 ENCOUNTER — Encounter: Payer: Self-pay | Admitting: Orthopaedic Surgery

## 2019-01-14 ENCOUNTER — Other Ambulatory Visit: Payer: Self-pay

## 2019-01-14 DIAGNOSIS — M1712 Unilateral primary osteoarthritis, left knee: Secondary | ICD-10-CM | POA: Diagnosis not present

## 2019-01-14 MED ORDER — HYLAN G-F 20 48 MG/6ML IX SOSY
48.0000 mg | PREFILLED_SYRINGE | INTRA_ARTICULAR | Status: AC | PRN
  Administered 2019-01-14: 48 mg via INTRA_ARTICULAR

## 2019-01-14 NOTE — Progress Notes (Signed)
   Procedure Note  Patient: Stacey Black             Date of Birth: 08/02/1960           MRN: [id number]             Visit Date: 01/14/2019  Procedures: Visit Diagnoses:  1. Unilateral primary osteoarthritis, left knee     Large Joint Inj: L knee on 01/14/2019 2:43 PM Indications: pain and diagnostic evaluation Details: 22 G 1.5 in needle, superolateral approach  Arthrogram: No  Medications: 48 mg Hylan 48 MG/6ML Outcome: tolerated well, no immediate complications Procedure, treatment alternatives, risks and benefits explained, specific risks discussed. Consent was given by the patient. Immediately prior to procedure a time out was called to verify the correct patient, procedure, equipment, support staff and site/side marked as required. Patient was prepped and draped in the usual sterile fashion.    The patient comes in today for scheduled hyaluronic acid injection with Synvisc 1 to treat the pain from osteoarthritis.  She is an active 58 year old female.  She has been working on strengthening her knee and her mobility.  She does get swelling on occasion.  On exam there is no effusion in her knee today she has good range of motion but global tenderness.  She did tolerate Synvisc 1 injection well without complications.  All question concerns were answered and addressed.  She knows that she can always have this repeated in 6 months if needed or repeat steroid injection.

## 2019-01-26 ENCOUNTER — Other Ambulatory Visit: Payer: Self-pay

## 2019-01-26 ENCOUNTER — Ambulatory Visit: Payer: BC Managed Care – PPO | Admitting: Physical Therapy

## 2019-01-26 ENCOUNTER — Encounter: Payer: Self-pay | Admitting: Physical Therapy

## 2019-01-26 DIAGNOSIS — R29898 Other symptoms and signs involving the musculoskeletal system: Secondary | ICD-10-CM

## 2019-01-26 DIAGNOSIS — M542 Cervicalgia: Secondary | ICD-10-CM | POA: Diagnosis not present

## 2019-01-26 DIAGNOSIS — M6281 Muscle weakness (generalized): Secondary | ICD-10-CM | POA: Diagnosis not present

## 2019-01-26 DIAGNOSIS — R293 Abnormal posture: Secondary | ICD-10-CM | POA: Diagnosis not present

## 2019-01-26 DIAGNOSIS — M25562 Pain in left knee: Secondary | ICD-10-CM

## 2019-01-26 NOTE — Therapy (Signed)
[facility] Physical Therapy [address] [place], [place], [address] Phone: [phone]   Fax:  [phone]  Physical Therapy Treatment  Patient Details  Name: Stacey Black MRN: [id number] Date of Birth: 1960/01/31 Referring Provider (PT): Mcarthur Rossetti, MD   Encounter Date: 01/26/2019  PT End of Session - 01/26/19 1343    Visit Number  2    Number of Visits  12    Date for PT Re-Evaluation  02/10/19    PT Start Time  1302    PT Stop Time  1343    PT Time Calculation (min)  41 min    Activity Tolerance  Patient tolerated treatment well    Behavior During Therapy  [facility] for tasks assessed/performed       Past Medical History:  Diagnosis Date  . Allergy   . Asthma    as a child  . Thyroid disease     Past Surgical History:  Procedure Laterality Date  . ABDOMINAL HYSTERECTOMY     2000    There were no vitals filed for this visit.  Subjective Assessment - 01/26/19 1301    Subjective  got gel injection for knee on the 30th and feels like that has been helpful; neck pain feels better.  headaches are only intermittent rather than daily. felt DN was somewhat beneficial.    Limitations  Other (comment)   dressing/bathing/stairs   Diagnostic tests  MRI: negative    Patient Stated Goals  improve strength, improve pain    Pain Onset  More than a month ago    Pain Score  --   no pain just tightness   Pain Onset  More than a month ago                       [facility] Adult PT Treatment/Exercise - 01/26/19 1307      Exercises   Exercises  Neck;Knee/Hip      Neck Exercises: Machines for Strengthening   Nustep  UE/LE L5 x 6 min      Neck Exercises: Seated   Other Seated Exercise  shoulder rolls backwards x 10 reps    Other Seated Exercise  scapular retraction 10 x 5 sec      Knee/Hip Exercises: Stretches   Passive Hamstring Stretch  Both;3 reps;30 seconds    Passive Hamstring Stretch Limitations  seated      Knee/Hip  Exercises: Seated   Long Arc Quad  Both;10 reps      Manual Therapy   Manual Therapy  Soft tissue mobilization    Manual therapy comments  skilled palpation and monitoring of soft tissue during DN    Soft tissue mobilization  bil suboccipitals and cervical paraspinals and into upper traps      Neck Exercises: Stretches   Upper Trapezius Stretch  Right;Left;2 reps;30 seconds       Trigger Point Dry Needling - 01/26/19 1328    Consent Given?  Yes    Education Handout Provided  Previously provided    Muscles Treated Head and Neck  Suboccipitals;Splenius capitus;Semispinalis capitus    Suboccipitals Response  Twitch response elicited;Palpable increased muscle length    Splenius capitus Response  Twitch reponse elicited;Palpable increased muscle length    Semispinalis capitus Response  Twitch reponse elicited;Palpable increased muscle length           PT Education - 01/26/19 1341    Education Details  HEP    Person(s) Educated  Patient  Methods  Explanation;Demonstration;Handout    Comprehension  Verbalized understanding;Returned demonstration;Need further instruction          PT Long Term Goals - 12/30/18 1429      PT LONG TERM GOAL #1   Title  independnet with HEP    Status  New    Target Date  02/10/19      PT LONG TERM GOAL #2   Title  demonstrate at least 4/5 LUE and LLE strength for improved function    Status  New    Target Date  02/10/19      PT LONG TERM GOAL #3   Title  report 50% reduction in buckling episodes for improved function    Status  New    Target Date  02/10/19      PT LONG TERM GOAL #4   Title  report 50% reduction in neck pain for improved function    Status  New    Target Date  02/10/19            Plan - 01/26/19 1343    Clinical Impression Statement  Pt tolerated session well today, needing min cues for current HEP.  Will continue to benefit from PT to maximize function.  No goals met as only 2nd visit.    Personal Factors and  Comorbidities  Fitness    Examination-Activity Limitations  Stand;Locomotion Level;Reach Overhead;Transfers;Caring for Others;Carry;Sleep;Stairs    Examination-Participation Restrictions  Meal Prep;Community Activity;Other   occupation   Stability/Clinical Decision Making  Evolving/Moderate complexity    Rehab Potential  Good    PT Frequency  2x / week    PT Duration  6 weeks    PT Treatment/Interventions  ADLs/Self Care Home Management;Cryotherapy;Electrical Stimulation;Ultrasound;Moist Heat;Iontophoresis '4mg'$ /ml Dexamethasone;Gait training;Stair training;Functional mobility training;Therapeutic activities;Therapeutic exercise;Balance training;Patient/family education;Neuromuscular re-education;Manual techniques;Vasopneumatic Device;Taping;Dry needling    PT Next Visit Plan  progress strengthening/posture exercises, assess response to DN, manual/modalities/DN PRN    PT Home Exercise Plan  Access Code: [email or web address]    Consulted and Agree with Plan of Care  Patient       Patient will benefit from skilled therapeutic intervention in order to improve the following deficits and impairments:  Abnormal gait, Increased fascial restricitons, Increased muscle spasms, Postural dysfunction, Pain, Decreased mobility, Decreased range of motion, Decreased strength, Impaired flexibility, Difficulty walking, Decreased balance, Decreased activity tolerance, Increased edema  Visit Diagnosis: Cervicalgia  Acute pain of left knee  Muscle weakness (generalized)  Abnormal posture  Other symptoms and signs involving the musculoskeletal system     Problem List There are no problems to display for this patient.     Laureen Abrahams, PT, DPT 01/26/19 1:51 PM    [facility] Physical Therapy [address] [place], [place], [address] Phone: [phone]   Fax:  [phone]  Name: Stacey Black MRN: [id number] Date of Birth: [birth year]

## 2019-01-26 NOTE — Patient Instructions (Signed)
Access Code: [email or web address]  URL: [email or web address]  Date: 01/26/2019  Prepared by: Moshe Cipro   Exercises Seated Scapular Retraction - 10 reps - 1 sets - 5 sec hold - 2x daily - 7x weekly Doorway Pec Stretch at 90 Degrees Abduction - 3 reps - 1 sets - 30 sec hold - 1x daily - 7x weekly Standing Backward Shoulder Rolls - 10 reps - 1 sets - 2x daily - 7x weekly Supine Bridge - 10 reps - 1 sets - 5 sec hold - 2x daily - 7x weekly Sidelying Hip Abduction - 1 sets - 10 reps - 2x daily - 7x weekly Seated Long Arc Quad - 10 reps - 1 sets - 2x daily - 7x weekly Seated Upper Trapezius Stretch - 3 reps - 1 sets - 30 sec hold - 2x daily - 7x weekly Seated Hamstring Stretch - 3 reps - 1 sets - 30 sec hold - 2x daily - 7x weekly Patient Education Trigger Point Dry Needling

## 2019-01-28 ENCOUNTER — Ambulatory Visit (INDEPENDENT_AMBULATORY_CARE_PROVIDER_SITE_OTHER): Payer: BC Managed Care – PPO | Admitting: Physical Therapy

## 2019-01-28 ENCOUNTER — Encounter: Payer: Self-pay | Admitting: Physical Therapy

## 2019-01-28 ENCOUNTER — Other Ambulatory Visit: Payer: Self-pay

## 2019-01-28 DIAGNOSIS — M25562 Pain in left knee: Secondary | ICD-10-CM | POA: Diagnosis not present

## 2019-01-28 DIAGNOSIS — R293 Abnormal posture: Secondary | ICD-10-CM

## 2019-01-28 DIAGNOSIS — M6281 Muscle weakness (generalized): Secondary | ICD-10-CM

## 2019-01-28 DIAGNOSIS — M542 Cervicalgia: Secondary | ICD-10-CM | POA: Diagnosis not present

## 2019-01-28 DIAGNOSIS — R29898 Other symptoms and signs involving the musculoskeletal system: Secondary | ICD-10-CM

## 2019-01-28 NOTE — Patient Instructions (Signed)
Gaze Stabilization: Sitting    Keeping eyes on target on wall 3-5 feet away, and move head side to side for _30-60___ seconds. Repeat while moving head up and down for __30-60__ seconds. Do __2-3__ sessions per day.  Copyright  VHI. All rights reserved.   Gaze Stabilization: Tip Card  1.Target must remain in focus, not blurry, and appear stationary while head is in motion. 2.Perform exercises with small head movements (45 to either side of midline). 3.Increase speed of head motion so long as target is in focus. 4.If you wear eyeglasses, be sure you can see target through lens (therapist will give specific instructions for bifocal / progressive lenses). 5.These exercises may provoke dizziness or nausea. Work through these symptoms. If too dizzy, slow head movement slightly. Rest between each exercise. 6.Exercises demand concentration; avoid distractions.  Copyright  VHI. All rights reserved.     

## 2019-01-28 NOTE — Therapy (Signed)
[facility] Physical Therapy [address] [place], [place], [address] Phone: [phone]   Fax:  [phone]  Physical Therapy Treatment  Patient Details  Name: Stacey Black MRN: [id number] Date of Birth: 1960/03/20 Referring Provider (PT): Mcarthur Rossetti, MD   Encounter Date: 01/28/2019  PT End of Session - 01/28/19 1409    Visit Number  3    Number of Visits  12    Date for PT Re-Evaluation  02/10/19    PT Start Time  7564    PT Stop Time  1410    PT Time Calculation (min)  53 min    Activity Tolerance  Patient tolerated treatment well    Behavior During Therapy  [facility] for tasks assessed/performed       Past Medical History:  Diagnosis Date  . Allergy   . Asthma    as a child  . Thyroid disease     Past Surgical History:  Procedure Laterality Date  . ABDOMINAL HYSTERECTOMY     2000    There were no vitals filed for this visit.  Subjective Assessment - 01/28/19 1320    Subjective  neck is sore, but feels "exactly the same."    Limitations  Other (comment)   dressing/bathing/stairs   Diagnostic tests  MRI: negative    Patient Stated Goals  improve strength, improve pain    Pain Score  0-No pain    Pain Location  Knee    Pain Onset  More than a month ago    Pain Score  6    Pain Location  Neck    Pain Orientation  Lower;Posterior    Pain Descriptors / Indicators  Tightness;Other (Comment)   stiffness   Pain Onset  More than a month ago    Pain Frequency  Intermittent    Aggravating Factors   carrying grandchildren    Pain Relieving Factors  hot showers; ibuprofen             Vestibular Assessment - 01/28/19 1401      Vestibulo-Ocular Reflex   VOR 1 Head Only (x 1 viewing)  min symptoms with horizontal/vertical      Positional Testing   Dix-Hallpike  Dix-Hallpike Right;Dix-Hallpike Left    Horizontal Canal Testing  Horizontal Canal Right;Horizontal Canal Left      Dix-Hallpike Right   Dix-Hallpike Right  Duration  none    Dix-Hallpike Right Symptoms  No nystagmus      Dix-Hallpike Left   Dix-Hallpike Left Duration  none    Dix-Hallpike Left Symptoms  No nystagmus      Horizontal Canal Right   Horizontal Canal Right Duration  none    Horizontal Canal Right Symptoms  Normal      Horizontal Canal Left   Horizontal Canal Left Duration  none    Horizontal Canal Left Symptoms  Normal               [facility] Adult PT Treatment/Exercise - 01/28/19 1321      Neck Exercises: Machines for Strengthening   Nustep  UE/LE L5 x 6 min      Neck Exercises: Theraband   Rows  15 reps;Green    Shoulder External Rotation  15 reps   L1; supine   Horizontal ABduction  15 reps   L1 band; supine   Other Theraband Exercises  supine overhead pull L1 band x 15 reps      Neck Exercises: Supine   Neck Retraction  10 reps;5 secs  Cervical Rotation  Both;10 reps   5 sec hold     Modalities   Modalities  Electrical Stimulation;Moist Heat      Moist Heat Therapy   Number Minutes Moist Heat  10 Minutes    Moist Heat Location  Cervical      Electrical Stimulation   Electrical Stimulation Location  neck/upper traps    Electrical Stimulation Action  IFC    Electrical Stimulation Parameters  to tolerance    Electrical Stimulation Goals  Pain      Neck Exercises: Stretches   Other Neck Stretches  doorway stretch 3x30 sec             PT Education - 01/28/19 1409    Education Details  gaze stab    Person(s) Educated  Patient    Methods  Explanation;Demonstration;Handout    Comprehension  Verbalized understanding;Returned demonstration;Need further instruction          PT Long Term Goals - 12/30/18 1429      PT LONG TERM GOAL #1   Title  independnet with HEP    Status  New    Target Date  02/10/19      PT LONG TERM GOAL #2   Title  demonstrate at least 4/5 LUE and LLE strength for improved function    Status  New    Target Date  02/10/19      PT LONG TERM GOAL #3   Title   report 50% reduction in buckling episodes for improved function    Status  New    Target Date  02/10/19      PT LONG TERM GOAL #4   Title  report 50% reduction in neck pain for improved function    Status  New    Target Date  02/10/19            Plan - 01/28/19 1411    Clinical Impression Statement  Session today focused on postural exercises with modalities for pain control.  Pt reported some dizziness with cervical rotation in supine today, so quick vestibular asssessment performed.  No indication of BPPV today, and does present with mild hypofunction. Gaze stabilization provided for HEP. Pt reported decreased pain and stiffness following session today.    Personal Factors and Comorbidities  Fitness    Examination-Activity Limitations  Stand;Locomotion Level;Reach Overhead;Transfers;Caring for Others;Carry;Sleep;Stairs    Examination-Participation Restrictions  Meal Prep;Community Activity;Other   occupation   Stability/Clinical Decision Making  Evolving/Moderate complexity    Rehab Potential  Good    PT Frequency  2x / week    PT Duration  6 weeks    PT Treatment/Interventions  ADLs/Self Care Home Management;Cryotherapy;Electrical Stimulation;Ultrasound;Moist Heat;Iontophoresis 4mg /ml Dexamethasone;Gait training;Stair training;Functional mobility training;Therapeutic activities;Therapeutic exercise;Balance training;Patient/family education;Neuromuscular re-education;Manual techniques;Vasopneumatic Device;Taping;Dry needling    PT Next Visit Plan  progress strengthening/posture exercises, assess response to DN, manual/modalities/DN PRN; assess dizziness PRN    PT Home Exercise Plan  Access Code: [email or web address]    Consulted and Agree with Plan of Care  Patient       Patient will benefit from skilled therapeutic intervention in order to improve the following deficits and impairments:  Abnormal gait, Increased fascial restricitons, Increased muscle spasms, Postural dysfunction, Pain,  Decreased mobility, Decreased range of motion, Decreased strength, Impaired flexibility, Difficulty walking, Decreased balance, Decreased activity tolerance, Increased edema  Visit Diagnosis: Cervicalgia  Acute pain of left knee  Muscle weakness (generalized)  Abnormal posture  Other symptoms and signs involving the musculoskeletal system  Problem List There are no problems to display for this patient.     Clarita Crane, PT, DPT 01/28/19 2:25 PM   [facility] Physical Therapy [address] [place], [place], [address] Phone: [phone]   Fax:  [phone]  Name: Stacey Black MRN: [id number] Date of Birth: 12/09/1960

## 2019-02-02 ENCOUNTER — Encounter: Payer: BC Managed Care – PPO | Admitting: Physical Therapy

## 2019-02-04 ENCOUNTER — Encounter: Payer: BC Managed Care – PPO | Admitting: Physical Therapy

## 2019-02-16 ENCOUNTER — Encounter: Payer: BC Managed Care – PPO | Admitting: Physical Therapy

## 2019-02-19 ENCOUNTER — Telehealth: Payer: Self-pay | Admitting: Emergency Medicine

## 2019-02-19 NOTE — Telephone Encounter (Signed)
Pt. Has a referral to Dr. Albertina Senegal office needs last/resent labs results sent to the office. The number of the office is [phone]. Would like a copy of the results for herself and for clinical to give her a call when they are are ready. Please advice

## 2019-02-19 NOTE — Telephone Encounter (Signed)
Spoke with pt to let her know that she can come and pick up a copy of her lab results and they will be placed up front at the clerical desk in appropriate area.I have the other copy of labs to fax over to [facility] office which I will do tomorrow 02/20/2019 when I contact the office to get the fax #.

## 2019-02-20 ENCOUNTER — Telehealth: Payer: Self-pay

## 2019-02-20 ENCOUNTER — Encounter: Payer: Self-pay | Admitting: Physical Therapy

## 2019-02-20 ENCOUNTER — Ambulatory Visit (INDEPENDENT_AMBULATORY_CARE_PROVIDER_SITE_OTHER): Payer: BC Managed Care – PPO | Admitting: Physical Therapy

## 2019-02-20 ENCOUNTER — Other Ambulatory Visit: Payer: Self-pay

## 2019-02-20 DIAGNOSIS — M542 Cervicalgia: Secondary | ICD-10-CM

## 2019-02-20 DIAGNOSIS — R293 Abnormal posture: Secondary | ICD-10-CM | POA: Diagnosis not present

## 2019-02-20 DIAGNOSIS — M25562 Pain in left knee: Secondary | ICD-10-CM

## 2019-02-20 DIAGNOSIS — R29898 Other symptoms and signs involving the musculoskeletal system: Secondary | ICD-10-CM

## 2019-02-20 DIAGNOSIS — M6281 Muscle weakness (generalized): Secondary | ICD-10-CM

## 2019-02-20 NOTE — Therapy (Signed)
[facility] Physical Therapy [address] [place], [place], [address] Phone: [phone]   Fax:  [phone]  Physical Therapy Treatment  Patient Details  Name: Stacey Black MRN: [id number] Date of Birth: Apr 19, 1960 Referring Provider (PT): Kathryne Hitch, MD   Encounter Date: 02/20/2019  PT End of Session - 02/20/19 1148    Visit Number  4    Number of Visits  12    Date for PT Re-Evaluation  03/20/19   extended due to missed weeks   PT Start Time  1108   pt arrived late   PT Stop Time  1143    PT Time Calculation (min)  35 min    Activity Tolerance  Patient tolerated treatment well    Behavior During Therapy  [facility] for tasks assessed/performed       Past Medical History:  Diagnosis Date  . Allergy   . Asthma    as a child  . Thyroid disease     Past Surgical History:  Procedure Laterality Date  . ABDOMINAL HYSTERECTOMY     2000    There were no vitals filed for this visit.  Subjective Assessment - 02/20/19 1111    Subjective  had to watch grandchildren for 7 days-back and shoulder were acting up.  feels neck and shoulder are improved.    Limitations  Other (comment)   dressing/bathing/stairs   Diagnostic tests  MRI: negative    Patient Stated Goals  improve strength, improve pain    Currently in Pain?  No/denies    Pain Onset  More than a month ago    Pain Onset  More than a month ago                       [facility] Adult PT Treatment/Exercise - 02/20/19 1115      Neck Exercises: Machines for Strengthening   Nustep  UE/LE L5 x 6 min      Neck Exercises: Theraband   Scapula Retraction  20 reps;Green    Shoulder Internal Rotation  20 reps;Green      Manual Therapy   Manual Therapy  Soft tissue mobilization    Manual therapy comments  skilled palpation and monitoring of soft tissue during DN    Soft tissue mobilization  Lt upper trap into levator and cervical paraspinals      Neck Exercises: Stretches   Other Neck Stretches  mid doorway stretch 3x30 sec       Trigger Point Dry Needling - 02/20/19 1147    Consent Given?  Yes    Education Handout Provided  Previously provided    Muscles Treated Head and Neck  Upper trapezius;Scalenes    Upper Trapezius Response  Twitch reponse elicited;Palpable increased muscle length    Scalenes Response  Twitch reponse elicited;Palpable increased muscle length                PT Long Term Goals - 02/20/19 1149      PT LONG TERM GOAL #1   Title  independnet with HEP    Status  New    Target Date  03/20/19      PT LONG TERM GOAL #2   Title  demonstrate at least 4/5 LUE and LLE strength for improved function    Status  New    Target Date  03/20/19      PT LONG TERM GOAL #3   Title  report 50% reduction in buckling episodes for improved function  Status  New    Target Date  03/20/19      PT LONG TERM GOAL #4   Title  report 50% reduction in neck pain for improved function    Status  New    Target Date  03/20/19            Plan - 02/20/19 1150    Clinical Impression Statement  Pt has missed many weeks of PT so date extended to reflect missed weeks.  Due to limited attendance, no significant change in pain or function at this time.  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    Personal Factors and Comorbidities  Fitness    Examination-Activity Limitations  Stand;Locomotion Level;Reach Overhead;Transfers;Caring for Others;Carry;Sleep;Stairs    Examination-Participation Restrictions  Meal Prep;Community Activity;Other   occupation   Stability/Clinical Decision Making  Evolving/Moderate complexity    Rehab Potential  Good    PT Frequency  2x / week    PT Duration  6 weeks    PT Treatment/Interventions  ADLs/Self Care Home Management;Cryotherapy;Electrical Stimulation;Ultrasound;Moist Heat;Iontophoresis 4mg /ml  Dexamethasone;Gait training;Stair training;Functional mobility training;Therapeutic activities;Therapeutic exercise;Balance training;Patient/family education;Neuromuscular re-education;Manual techniques;Vasopneumatic Device;Taping;Dry needling    PT Next Visit Plan  assess response to DN; continue with postural exercises; modalities/manual/DN PRN    PT Home Exercise Plan  Access Code: [email or web address]    Consulted and Agree with Plan of Care  Patient       Patient will benefit from skilled therapeutic intervention in order to improve the following deficits and impairments:  Abnormal gait, Increased fascial restricitons, Increased muscle spasms, Postural dysfunction, Pain, Decreased mobility, Decreased range of motion, Decreased strength, Impaired flexibility, Difficulty walking, Decreased balance, Decreased activity tolerance, Increased edema  Visit Diagnosis: Cervicalgia  Acute pain of left knee  Muscle weakness (generalized)  Abnormal posture  Other symptoms and signs involving the musculoskeletal system     Problem List There are no problems to display for this patient.     Laureen Abrahams, PT, DPT 02/20/19 11:53 AM     [facility] Physical Therapy [address] [place], [place], [address] Phone: [phone]   Fax:  [phone]  Name: Stacey Black MRN: [id number] Date of Birth: [birth year]

## 2019-02-20 NOTE — Telephone Encounter (Signed)
Medication list and recent labs has been faxed over to Dr. Reather Littler office on 02/20/2019

## 2019-02-26 ENCOUNTER — Other Ambulatory Visit: Payer: Self-pay

## 2019-02-27 ENCOUNTER — Ambulatory Visit (INDEPENDENT_AMBULATORY_CARE_PROVIDER_SITE_OTHER): Payer: BC Managed Care – PPO | Admitting: Rehabilitative and Restorative Service Providers"

## 2019-02-27 ENCOUNTER — Encounter: Payer: Self-pay | Admitting: Rehabilitative and Restorative Service Providers"

## 2019-02-27 DIAGNOSIS — R293 Abnormal posture: Secondary | ICD-10-CM | POA: Diagnosis not present

## 2019-02-27 DIAGNOSIS — M6281 Muscle weakness (generalized): Secondary | ICD-10-CM | POA: Diagnosis not present

## 2019-02-27 DIAGNOSIS — M542 Cervicalgia: Secondary | ICD-10-CM

## 2019-02-27 DIAGNOSIS — R29898 Other symptoms and signs involving the musculoskeletal system: Secondary | ICD-10-CM

## 2019-02-27 DIAGNOSIS — M25562 Pain in left knee: Secondary | ICD-10-CM

## 2019-02-27 NOTE — Therapy (Signed)
[facility] Physical Therapy [address] [place], [place], [address] Phone: [phone]   Fax:  [phone]  Physical Therapy Treatment  Patient Details  Name: Stacey Black MRN: [id number] Date of Birth: Sep 25, 1960 Referring Provider (PT): Kathryne Hitch, MD   Encounter Date: 02/27/2019  PT End of Session - 02/27/19 1206    Visit Number  5    Number of Visits  12    Date for PT Re-Evaluation  03/20/19    PT Start Time  1106    PT Stop Time  1148    PT Time Calculation (min)  42 min    Activity Tolerance  Patient tolerated treatment well    Behavior During Therapy  [facility] for tasks assessed/performed       Past Medical History:  Diagnosis Date  . Allergy   . Asthma    as a child  . Thyroid disease     Past Surgical History:  Procedure Laterality Date  . ABDOMINAL HYSTERECTOMY     2000    There were no vitals filed for this visit.  Subjective Assessment - 02/27/19 1109    Subjective  Pt. stated having grandkids again for several days, feeling tired and tight from that.  Pt. stated some relief of tension but it seems to come back.    Limitations  Other (comment)   dressing/bathing/stairs   Diagnostic tests  MRI: negative    Patient Stated Goals  improve strength, improve pain    Currently in Pain?  No/denies    Pain Score  0-No pain    Pain Onset  More than a month ago    Pain Score  3    Pain Location  Neck    Pain Type  Acute pain    Pain Onset  More than a month ago                       [facility] Adult PT Treatment/Exercise - 02/27/19 0001      Neck Exercises: Machines for Strengthening   Nustep  UE/LE L5 x 6 min      Neck Exercises: Theraband   Scapula Retraction  20 reps;Green      Neck Exercises: Standing   Other Standing Exercises  green band rows 3x10      Neck Exercises: Seated   Other Seated Exercise  sitting Lt upper trap stretching 15 sec x 5       Manual Therapy   Manual Therapy  Myofascial  release    Myofascial Release  Lt upper trap STM, IMT c active compression release             PT Education - 02/27/19 1124    Education Details  HEP    Methods  Explanation    Comprehension  Verbalized understanding          PT Long Term Goals - 02/20/19 1149      PT LONG TERM GOAL #1   Title  independnet with HEP    Status  New    Target Date  03/20/19      PT LONG TERM GOAL #2   Title  demonstrate at least 4/5 LUE and LLE strength for improved function    Status  New    Target Date  03/20/19      PT LONG TERM GOAL #3   Title  report 50% reduction in buckling episodes for improved function    Status  New  Target Date  03/20/19      PT LONG TERM GOAL #4   Title  report 50% reduction in neck pain for improved function    Status  New    Target Date  03/20/19            Plan - 02/27/19 1201    Clinical Impression Statement  Pt. presented c strong reproduction of concordant symptoms in Lt upper trap myofascial trigger point.  Treated appropriately in manual c improved cervical mobility c reduced symptoms noted during post treatment testing.   Postural awareness and positioning during day continues to be evident in symptom presentation due to work requirments.    Personal Factors and Comorbidities  Fitness    Examination-Activity Limitations  Stand;Locomotion Level;Reach Overhead;Transfers;Caring for Others;Carry;Sleep;Stairs    Examination-Participation Restrictions  Meal Prep;Community Activity;Other   occupation   Stability/Clinical Decision Making  Evolving/Moderate complexity    Rehab Potential  Good    PT Frequency  2x / week    PT Duration  6 weeks    PT Treatment/Interventions  ADLs/Self Care Home Management;Cryotherapy;Electrical Stimulation;Ultrasound;Moist Heat;Iontophoresis 4mg /ml Dexamethasone;Gait training;Stair training;Functional mobility training;Therapeutic activities;Therapeutic exercise;Balance training;Patient/family  education;Neuromuscular re-education;Manual techniques;Vasopneumatic Device;Taping;Dry needling    PT Next Visit Plan  continue with postural exercises; modalities/manual/DN PRN    PT Home Exercise Plan  Access Code: [email or web address]    Consulted and Agree with Plan of Care  Patient       Patient will benefit from skilled therapeutic intervention in order to improve the following deficits and impairments:  Abnormal gait, Increased fascial restricitons, Increased muscle spasms, Postural dysfunction, Pain, Decreased mobility, Decreased range of motion, Decreased strength, Impaired flexibility, Difficulty walking, Decreased balance, Decreased activity tolerance, Increased edema  Visit Diagnosis: Cervicalgia  Acute pain of left knee  Muscle weakness (generalized)  Abnormal posture  Other symptoms and signs involving the musculoskeletal system     Problem List There are no problems to display for this patient.  Scot Jun, PT, DPT, OCS, ATC 02/27/19  12:11 PM    [facility] [address] [place], [place], [address] Phone: [phone]   Fax:  [phone]  Name: Stacey Black MRN: [id number] Date of Birth: 30-Apr-1960

## 2019-03-02 ENCOUNTER — Other Ambulatory Visit: Payer: Self-pay

## 2019-03-02 ENCOUNTER — Ambulatory Visit: Payer: BC Managed Care – PPO | Admitting: Endocrinology

## 2019-03-02 ENCOUNTER — Encounter: Payer: Self-pay | Admitting: Endocrinology

## 2019-03-02 VITALS — BP 132/80 | HR 93 | Ht 66.0 in | Wt 233.6 lb

## 2019-03-02 DIAGNOSIS — R5383 Other fatigue: Secondary | ICD-10-CM

## 2019-03-02 DIAGNOSIS — E063 Autoimmune thyroiditis: Secondary | ICD-10-CM

## 2019-03-02 LAB — TSH: TSH: 2.02 u[IU]/mL (ref 0.35–4.50)

## 2019-03-02 LAB — T3, FREE: T3, Free: 3.5 pg/mL (ref 2.3–4.2)

## 2019-03-02 LAB — T4, FREE: Free T4: 0.92 ng/dL (ref 0.60–1.60)

## 2019-03-02 NOTE — Progress Notes (Signed)
Patient ID: Stacey Black, female   DOB: 1961/01/13, 59 y.o.   MRN: [id number]            Referring Provider: Carole Civil  Reason for Appointment:  Hypothyroidism, new visit    History of Present Illness:   Hypothyroidism was first diagnosed in 1989  Previous history or details are not available but likely the patient had issues with fatigue She was also having more issues after her pregnancies        The patient has been treated previously with mostly brand-name Synthroid up to 200 mcg daily    She did not have any consistent follow-up for her thyroid treatment and stopped taking her Synthroid in 2013 Apparently did not feel any worse after stopping this  In 12/20 she had a routine exam with her PCP At that time she was having symptoms of fatigue which however has been going on for quite some time and she had blamed it on taking care of multiple family members as well as working She was having some issues with losing some hair and dry skin She thinks that also she was having a feeling of swelling in her neck especially the left side for some time but does not think this was any worse, may have been a little more tender  Her PCP empirically started her on 100 mcg of levothyroxine when her TSH was 4.9 Free thyroxine index was normal at 1.9  The patient takes the thyroid supplement about an hour before breakfast, she does take multivitamins with breakfast  Since taking her levothyroxine in December 2020 she has had some improvement in her fatigue, hair loss and dry skin Also has a little less constipation.  She thinks she was having a little swelling in her feet which is better too Her weight had gone up last year but this was likely to be from her not being active and eating more Recently starting to improve her diet         Patient's weight history is as follows:  Wt Readings from Last 3 Encounters:  03/02/19 233 lb 9.6 oz (106 kg)  12/24/18 236 lb (107 kg)  10/02/18  225 lb (102.1 kg)    Thyroid function results have been as follows:  Lab Results  Component Value Date   TSH 4.900 (H) 12/24/2018     Past Medical History:  Diagnosis Date  . Allergy   . Asthma    as a child  . Thyroid disease     Past Surgical History:  Procedure Laterality Date  . ABDOMINAL HYSTERECTOMY     2000    Family History  Problem Relation Age of Onset  . Arthritis Mother   . Asthma Mother   . Vision loss Mother     Social History:  reports that she has never smoked. She has never used smokeless tobacco. She reports current alcohol use of about 3.0 standard drinks of alcohol per week. She reports that she does not use drugs.  Allergies:  Allergies  Allergen Reactions  . Penicillins Swelling  . Bee Venom Swelling    Allergies as of 03/02/2019      Reactions   Penicillins Swelling   Bee Venom Swelling      Medication List       Accurate as of March 02, 2019  2:27 PM. If you have any questions, ask your nurse or doctor.        STOP taking these medications   cyclobenzaprine 10  MG tablet Commonly known as: FLEXERIL Stopped by: Reather Littler, MD   predniSONE 20 MG tablet Commonly known as: DELTASONE Stopped by: Reather Littler, MD     TAKE these medications   Active 1st Blood Lancets 30G Misc by Does not apply route.   ibuprofen 600 MG tablet Commonly known as: ADVIL Take 1 tablet (600 mg total) by mouth every 6 (six) hours as needed.   levothyroxine 100 MCG tablet Commonly known as: SYNTHROID Take 1 tablet (100 mcg total) by mouth daily.   multivitamin tablet Take 1 tablet by mouth daily.   OVER THE COUNTER MEDICATION   OVER THE COUNTER MEDICATION   PROBIOTIC DAILY PO Take by mouth.   pyridOXINE 100 MG tablet Commonly known as: VITAMIN B-6 Take 100 mg by mouth once a week.   vitamin C 1000 MG tablet Take 1,000 mg by mouth daily.   VITAMIN D3 PO Take 5,000 mg by mouth once a week.   Zinc 100 MG Tabs Take by mouth once a  week.          Review of Systems  Constitutional: Positive for weight gain.  HENT: Negative for hoarseness and trouble swallowing.   Respiratory:       She will occasionally have asthma  Cardiovascular: Positive for leg swelling.  Gastrointestinal: Positive for constipation.  Endocrine: Positive for fatigue.  Musculoskeletal: Positive for joint pain.  Skin: Positive for dry skin.       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  Psychiatric/Behavioral: Positive for insomnia.   Weight history:  Wt Readings from Last 3 Encounters:  03/02/19 233 lb 9.6 oz (106 kg)  12/24/18 236 lb (107 kg)  10/02/18 225 lb (102.1 kg)                Examination:    BP 132/80 (BP Location: Left Arm, Patient Position: Sitting, Cuff Size: Large)   Pulse 93   Ht 5\' 6"  (1.676 m)   Wt 233 lb 9.6 oz (106 kg)   SpO2 97%   BMI 37.70 kg/m   GENERAL:  Has generalized obesity  No pallor.    Skin:  no rash or significant skin lesions.  EYES:  No prominence of the eyes or swelling of the eyelids  ENT: Oral mucosa and tongue normal.  NECK: No lymphadenopathy  THYROID: Just palpable on the left side especially lateral lobe, slightly firm and irregular.  Right lobe not clearly palpable  HEART:  Normal  S1 and S2; no murmur or click.  CHEST:    Lungs: Vescicular breath sounds heard equally.  No crepitations/ wheeze.  ABDOMEN:  No distention.  Liver and spleen not palpable.  No other mass or tenderness.  NEUROLOGICAL: Reflexes are bilaterally normal at biceps, difficult to elicit at ankles but appear normal.  EXTREMITIES:  Normal peripheral joints.  No ankle edema present   Assessment:  HYPOTHYROIDISM, mild with TSH 4.9 Although she has previous history of significant longstanding hypothyroidism she did not have any follow-up since she stopped her levothyroxine on her own in 2013 Her symptoms are very nonspecific although  she thinks that she had symptoms of hair loss and dry skin along with her fatigue The symptoms improved with starting levothyroxine 100 mcg She also thinks that she had a goiter previously which cannot be confirmed Today's exam shows only minimal thyroid enlargement on the left  Although her hypothyroidism is likely very mild she has been started  on a large dose of 100 mcg daily, does not appear to have any signs or symptoms of over replacement  PLAN:  Thyroid levels to be checked today Her levothyroxine dosage will be adjusted according to the results; since she is subjectively doing well with levothyroxine even with a mild increase in TSH likely needs to continue this long-term Thyroid peroxidase antibody will be checked Patient information on hypothyroidism given  Follow-up in 6 months if no change in regimen needed   Reather Littler 03/02/2019, 2:27 PM   Consultation note copy sent to the PCP  Note: This office note was prepared with Dragon voice recognition system technology. Any transcriptional errors that result from this process are unintentional.  Addendum: TSH is 2.02, she will continue 100 mcg and follow-up in 6 months

## 2019-03-03 LAB — THYROID PEROXIDASE ANTIBODY: Thyroperoxidase Ab SerPl-aCnc: 235 IU/mL — ABNORMAL HIGH (ref 0–34)

## 2019-03-04 ENCOUNTER — Encounter: Payer: Self-pay | Admitting: Physical Therapy

## 2019-03-04 ENCOUNTER — Other Ambulatory Visit: Payer: Self-pay

## 2019-03-04 ENCOUNTER — Ambulatory Visit (INDEPENDENT_AMBULATORY_CARE_PROVIDER_SITE_OTHER): Payer: BC Managed Care – PPO | Admitting: Physical Therapy

## 2019-03-04 DIAGNOSIS — M6281 Muscle weakness (generalized): Secondary | ICD-10-CM

## 2019-03-04 DIAGNOSIS — R293 Abnormal posture: Secondary | ICD-10-CM | POA: Diagnosis not present

## 2019-03-04 DIAGNOSIS — R29898 Other symptoms and signs involving the musculoskeletal system: Secondary | ICD-10-CM

## 2019-03-04 DIAGNOSIS — M542 Cervicalgia: Secondary | ICD-10-CM | POA: Diagnosis not present

## 2019-03-04 DIAGNOSIS — M25562 Pain in left knee: Secondary | ICD-10-CM | POA: Diagnosis not present

## 2019-03-04 NOTE — Therapy (Signed)
[facility] Physical Therapy [address] [place], [place], [address] Phone: [phone]   Fax:  [phone]  Physical Therapy Treatment  Patient Details  Name: Stacey Black MRN: [id number] Date of Birth: 04-20-1960 Referring Provider (PT): Mcarthur Rossetti, MD   Encounter Date: 03/04/2019  PT End of Session - 03/04/19 1247    Visit Number  6    Number of Visits  12    Date for PT Re-Evaluation  03/20/19    PT Start Time  1100    PT Stop Time  1148    PT Time Calculation (min)  48 min    Activity Tolerance  Patient tolerated treatment well    Behavior During Therapy  [facility] for tasks assessed/performed       Past Medical History:  Diagnosis Date  . Allergy   . Asthma    as a child  . Thyroid disease     Past Surgical History:  Procedure Laterality Date  . ABDOMINAL HYSTERECTOMY     2000    There were no vitals filed for this visit.  Subjective Assessment - 03/04/19 1104    Subjective  feeling better - sore for 3 days after last session from DN    Limitations  Other (comment)   dressing/bathing/stairs   Diagnostic tests  MRI: negative    Patient Stated Goals  improve strength, improve pain    Currently in Pain?  No/denies   has some mild Lt knee pain   Pain Onset  More than a month ago    Pain Onset  More than a month ago                       [facility] Adult PT Treatment/Exercise - 03/04/19 1106      Self-Care   Other Self-Care Comments   discussed avoiding pivoting motions on knee to decrease pain and this will unlikely change with OA at this time.  Discussed current progress and POC and feel she is nearing d/c.      Neck Exercises: Machines for Strengthening   Nustep  UE/LE L5 x 6 min      Moist Heat Therapy   Number Minutes Moist Heat  10 Minutes    Moist Heat Location  Cervical      Manual Therapy   Manual Therapy  Soft tissue mobilization;Joint mobilization    Joint Mobilization  Lt cervical lateral  glides and UPA mobs grades 2-3; improved pain with Lt cervical rotation following    Soft tissue mobilization  Lt upper trap into levator and cervical paraspinals      Neck Exercises: Stretches   Upper Trapezius Stretch  Right;Left;30 seconds;3 reps    Other Neck Stretches  seated towel stretch 3x20 sec bil - cues for technique and attention to task                  PT Long Term Goals - 02/20/19 1149      PT LONG TERM GOAL #1   Title  independnet with HEP    Status  New    Target Date  03/20/19      PT LONG TERM GOAL #2   Title  demonstrate at least 4/5 LUE and LLE strength for improved function    Status  New    Target Date  03/20/19      PT LONG TERM GOAL #3   Title  report 50% reduction in buckling episodes for improved function  Status  New    Target Date  03/20/19      PT LONG TERM GOAL #4   Title  report 50% reduction in neck pain for improved function    Status  New    Target Date  03/20/19            Plan - 03/04/19 1248    Clinical Impression Statement  Pt tolerated session well today, and overall progressing well with PT.  Still has some mild symptoms, and she will likely be ready for d/c next 1-2 weeks.  Plan to begin d/c preparations and update HEP to maximize function and progress.    Personal Factors and Comorbidities  Fitness    Examination-Activity Limitations  Stand;Locomotion Level;Reach Overhead;Transfers;Caring for Others;Carry;Sleep;Stairs    Examination-Participation Restrictions  Meal Prep;Community Activity;Other   occupation   Stability/Clinical Decision Making  Evolving/Moderate complexity    Rehab Potential  Good    PT Frequency  2x / week    PT Duration  6 weeks    PT Treatment/Interventions  ADLs/Self Care Home Management;Cryotherapy;Electrical Stimulation;Ultrasound;Moist Heat;Iontophoresis 4mg /ml Dexamethasone;Gait training;Stair training;Functional mobility training;Therapeutic activities;Therapeutic exercise;Balance  training;Patient/family education;Neuromuscular re-education;Manual techniques;Vasopneumatic Device;Taping;Dry needling    PT Next Visit Plan  continue with postural exercises; modalities/manual/DN PRN; begin d/c preparations    PT Home Exercise Plan  Access Code: [email or web address]    Consulted and Agree with Plan of Care  Patient       Patient will benefit from skilled therapeutic intervention in order to improve the following deficits and impairments:  Abnormal gait, Increased fascial restricitons, Increased muscle spasms, Postural dysfunction, Pain, Decreased mobility, Decreased range of motion, Decreased strength, Impaired flexibility, Difficulty walking, Decreased balance, Decreased activity tolerance, Increased edema  Visit Diagnosis: Cervicalgia  Acute pain of left knee  Muscle weakness (generalized)  Abnormal posture  Other symptoms and signs involving the musculoskeletal system     Problem List There are no problems to display for this patient.     , PT, DPT 03/04/19 12:50 PM    [facility] Physical Therapy [address] [place], [place], [place] Phone: [phone]   Fax:  [phone]  Name: Tania Steinhauser MRN: Riley Nearing Date of Birth: [birth year]

## 2019-03-04 NOTE — Patient Instructions (Signed)
Access Code: [email or web address]  URL: [email or web address]  Date: 03/04/2019  Prepared by: Moshe Cipro   Exercises Seated Scapular Retraction - 10 reps - 1 sets - 5 sec hold - 2x daily - 7x weekly Doorway Pec Stretch at 90 Degrees Abduction - 3 reps - 1 sets - 30 sec hold - 1x daily - 7x weekly Standing Backward Shoulder Rolls - 10 reps - 1 sets - 2x daily - 7x weekly Supine Bridge - 10 reps - 1 sets - 5 sec hold - 2x daily - 7x weekly Sidelying Hip Abduction - 1 sets - 10 reps - 2x daily - 7x weekly Seated Long Arc Quad - 10 reps - 1 sets - 2x daily - 7x weekly Seated Upper Trapezius Stretch - 3 reps - 1 sets - 30 sec hold - 2x daily - 7x weekly Seated Hamstring Stretch - 3 reps - 1 sets - 30 sec hold - 2x daily - 7x weekly Seated Assisted Cervical Rotation with Towel - 5 reps - 1 sets - 10 sec hold - 2x daily - 7x weekly Patient Education Trigger Point Dry Needling

## 2019-03-12 ENCOUNTER — Encounter: Payer: Self-pay | Admitting: Rehabilitative and Restorative Service Providers"

## 2019-03-12 ENCOUNTER — Ambulatory Visit (INDEPENDENT_AMBULATORY_CARE_PROVIDER_SITE_OTHER): Payer: BC Managed Care – PPO | Admitting: Rehabilitative and Restorative Service Providers"

## 2019-03-12 ENCOUNTER — Other Ambulatory Visit: Payer: Self-pay

## 2019-03-12 DIAGNOSIS — M542 Cervicalgia: Secondary | ICD-10-CM

## 2019-03-12 DIAGNOSIS — M6281 Muscle weakness (generalized): Secondary | ICD-10-CM

## 2019-03-12 DIAGNOSIS — R293 Abnormal posture: Secondary | ICD-10-CM | POA: Diagnosis not present

## 2019-03-12 DIAGNOSIS — M25562 Pain in left knee: Secondary | ICD-10-CM | POA: Diagnosis not present

## 2019-03-12 DIAGNOSIS — R29898 Other symptoms and signs involving the musculoskeletal system: Secondary | ICD-10-CM

## 2019-03-12 NOTE — Therapy (Signed)
[facility] Physical Therapy [address] [place], [place], [address] Phone: [phone]   Fax:  [phone]  Physical Therapy Treatment  Patient Details  Name: Stacey Black MRN: [id number] Date of Birth: Oct 22, 1960 Referring Provider (PT): Kathryne Hitch, MD   Encounter Date: 03/12/2019  PT End of Session - 03/12/19 1142    Visit Number  7    Number of Visits  12    Date for PT Re-Evaluation  03/20/19    PT Start Time  1102    PT Stop Time  1152    PT Time Calculation (min)  50 min    Activity Tolerance  Patient tolerated treatment well    Behavior During Therapy  [facility] for tasks assessed/performed       Past Medical History:  Diagnosis Date  . Allergy   . Asthma    as a child  . Thyroid disease     Past Surgical History:  Procedure Laterality Date  . ABDOMINAL HYSTERECTOMY     2000    There were no vitals filed for this visit.  Subjective Assessment - 03/12/19 1105    Subjective  Pt. stated stress level is up from brother's event.  Pt. stated some back spasms in lumbar but different than neck symptoms.  Pt. stated feeling some improvements overall.    Limitations  Other (comment)   dressing/bathing/stairs   Diagnostic tests  MRI: negative    Patient Stated Goals  improve strength, improve pain    Currently in Pain?  No/denies    Pain Score  0-No pain    Pain Onset  More than a month ago    Pain Score  5    Pain Location  Neck    Pain Orientation  Lower;Posterior    Pain Onset  More than a month ago                       [facility] Adult PT Treatment/Exercise - 03/12/19 0001      Neck Exercises: Machines for Strengthening   UBE (Upper Arm Bike)  Fwd/rev 3 mins each way L1      Neck Exercises: Standing   Other Standing Exercises  Green band rows 3 x 15      Neck Exercises: Seated   Other Seated Exercise  seated thoracic rotation Lt and Rt x 10, seated thoracic extension over chair x15    Other Seated Exercise   seated scapular retraction c bilaterally UE ER 2 x 10      Electrical Stimulation   Electrical Stimulation Location  Lt upper trap/neck    Electrical Stimulation Action  pre mod    Electrical Stimulation Parameters  to tolerance    Electrical Stimulation Goals  Pain      Manual Therapy   Joint Mobilization  prone T3-T9 g3-g4 cPA, G4-G5 regional thoracic PA mobs       Trigger Point Dry Needling - 03/12/19 0001    Consent Given?  Yes    Education Handout Provided  Previously provided    Muscles Treated Head and Neck  Upper trapezius    Upper Trapezius Response  Twitch reponse elicited           PT Education - 03/12/19 1136    Education Details  Cues for intervention.          PT Long Term Goals - 02/20/19 1149      PT LONG TERM GOAL #1   Title  independnet  with HEP    Status  New    Target Date  03/20/19      PT LONG TERM GOAL #2   Title  demonstrate at least 4/5 LUE and LLE strength for improved function    Status  New    Target Date  03/20/19      PT LONG TERM GOAL #3   Title  report 50% reduction in buckling episodes for improved function    Status  New    Target Date  03/20/19      PT LONG TERM GOAL #4   Title  report 50% reduction in neck pain for improved function    Status  New    Target Date  03/20/19            Plan - 03/12/19 1137    Clinical Impression Statement  Indications of mid and upper thoracic passive mobility limitations that impair cervical and thoracic mobility at this time.  Continued upper trap related symptoms noted and reported but reducing in severity.  Continue to address postural activation HEP.    Personal Factors and Comorbidities  Fitness    Examination-Activity Limitations  Stand;Locomotion Level;Reach Overhead;Transfers;Caring for Others;Carry;Sleep;Stairs    Examination-Participation Restrictions  Meal Prep;Community Activity;Other   occupation   Stability/Clinical Decision Making  Evolving/Moderate complexity     Rehab Potential  Good    PT Frequency  2x / week    PT Duration  6 weeks    PT Treatment/Interventions  ADLs/Self Care Home Management;Cryotherapy;Electrical Stimulation;Ultrasound;Moist Heat;Iontophoresis 4mg /ml Dexamethasone;Gait training;Stair training;Functional mobility training;Therapeutic activities;Therapeutic exercise;Balance training;Patient/family education;Neuromuscular re-education;Manual techniques;Vasopneumatic Device;Taping;Dry needling    PT Next Visit Plan  DN prn, thoracic and cervical mobilty c postural activation continued.    PT Home Exercise Plan  Access Code: [email or web address]    Consulted and Agree with Plan of Care  Patient       Patient will benefit from skilled therapeutic intervention in order to improve the following deficits and impairments:  Abnormal gait, Increased fascial restricitons, Increased muscle spasms, Postural dysfunction, Pain, Decreased mobility, Decreased range of motion, Decreased strength, Impaired flexibility, Difficulty walking, Decreased balance, Decreased activity tolerance, Increased edema  Visit Diagnosis: Cervicalgia  Acute pain of left knee  Muscle weakness (generalized)  Abnormal posture  Other symptoms and signs involving the musculoskeletal system     Problem List There are no problems to display for this patient.   Scot Jun, PT, DPT, OCS, ATC 03/12/19  11:44 AM    [facility] Physical Therapy [address] [place], [place], [address] Phone: [phone]   Fax:  [phone]  Name: Stacey Black MRN: [id number] Date of Birth: 07-19-1960

## 2019-03-20 ENCOUNTER — Other Ambulatory Visit: Payer: Self-pay

## 2019-03-20 ENCOUNTER — Ambulatory Visit: Payer: Self-pay | Admitting: Rehabilitative and Restorative Service Providers"

## 2019-03-20 DIAGNOSIS — M542 Cervicalgia: Secondary | ICD-10-CM

## 2019-03-20 DIAGNOSIS — R293 Abnormal posture: Secondary | ICD-10-CM

## 2019-03-20 DIAGNOSIS — M6281 Muscle weakness (generalized): Secondary | ICD-10-CM

## 2019-03-20 DIAGNOSIS — M25562 Pain in left knee: Secondary | ICD-10-CM

## 2019-03-20 DIAGNOSIS — R29898 Other symptoms and signs involving the musculoskeletal system: Secondary | ICD-10-CM

## 2019-03-20 NOTE — Therapy (Addendum)
[facility] [address] [place], [place], [address] Phone: [phone]   Fax:  [phone]  Physical Therapy Treatment Note/Discharge  Patient Details  Name: Stacey Black MRN: [id number] Date of Birth: 10-04-1960 Referring Provider (PT): Mcarthur Rossetti, MD   Encounter Date: 03/20/2019  PT End of Session - 03/20/19 0953    Visit Number  8    Number of Visits  20   Added visits to original POC due to remaining impairments.   Date for PT Re-Evaluation  05/01/19    PT Start Time  0930    PT Stop Time  1020    PT Time Calculation (min)  50 min    Activity Tolerance  Patient tolerated treatment well    Behavior During Therapy  WFL for tasks assessed/performed       Past Medical History:  Diagnosis Date  . Allergy   . Asthma    as a child  . Thyroid disease     Past Surgical History:  Procedure Laterality Date  . ABDOMINAL HYSTERECTOMY     2000    There were no vitals filed for this visit.   Subjective Assessment - 03/20/19 0944    Subjective  Pt. reported feeling some improvement in neck and back mobility since last visit.  Still having some trouble c L knee c stairs primarily.   Rated 50% overall improvement at this time.    Limitations  Other (comment)   dressing/bathing/stairs   Diagnostic tests  MRI: negative    Patient Stated Goals  improve strength, improve pain    Currently in Pain?  Yes    Pain Score  4    pain at worst 6/10   Pain Location  Knee    Pain Orientation  Left    Pain Descriptors / Indicators  Aching;Dull    Pain Onset  More than a month ago    Aggravating Factors   stairs, lifting/carrying kids, squat    Pain Score  0   pain at worst   4/10.   Pain Location  Neck    Pain Orientation  Lower    Pain Descriptors / Indicators  Tightness    Pain Onset  More than a month ago    Aggravating Factors   carrying/reaching, looking down, stiff in morning.         [facility] PT Assessment - 03/20/19  0001      AROM   Cervical Flexion  50   lower cervical tightness   Cervical Extension  50    Cervical - Right Rotation  62   upper trap tightness bilaterally   Cervical - Left Rotation  60   upper trap tightness bilaterally     Strength   Left Shoulder Flexion  4/5    Left Shoulder ABduction  4+/5    Left Hip Flexion  4/5    Left Knee Flexion  5/5    Left Knee Extension  4/5                Objective measurements completed on examination: See above findings.      [facility] PT Treatment/Exercise - 03/20/19 0001      Neck Exercises: Machines for Strengthening   Nustep  L10 mins L6      Neck Exercises: Standing   Other Standing Exercises  Blue band rows 3 x 10       Neck Exercises: Seated   Neck Retraction  20 reps  Other Seated Exercise  seated thoracic rotation Lt and Rt x 10, seated thoracic extension over chair x15    Other Seated Exercise  seated scapular retraction c bilaterally UE ER 2 x 10      Knee/Hip Exercises: Standing   Lateral Step Up  Left;20 reps;Step Height: 4"      Knee/Hip Exercises: Seated   Sit to Sand  10 reps      Acupuncturist Location  Bilateral  upper trap/neck    Electrical Stimulation Action  pre mod    Electrical Stimulation Parameters  to tolerance    Electrical Stimulation Goals  Pain      Manual Therapy   Joint Mobilization  prone T3-T9 g3-g4 cPA, G4-G5 regional thoracic PA mobs             PT Education - 03/20/19 0947    Education Details  Review of HEP, exercise    Person(s) Educated  Patient    Methods  Explanation;Demonstration;Verbal cues    Comprehension  Verbalized understanding;Returned demonstration          PT Long Term Goals - 03/20/19 0947      PT LONG TERM GOAL #1   Title  Patient will demonstrate independent use of home exercise program to facilitate ability to maintain/progress functional gains from skilled physical therapy services.    Time  6     Period  Weeks    Status  Partially Met   Knowledge but self reported to be inconsistent c HEP.   Target Date  05/01/19      PT LONG TERM GOAL #2   Title  demonstrate at least 4/5 LUE and LLE strength for improved function    Baseline  03/20/3019 update: progress made in strength but impairments still noted in select areas.    Time  6    Period  Weeks    Status  Revised    Target Date  05/01/19      PT LONG TERM GOAL #3   Title  report 50% reduction in buckling episodes for improved function    Status  Achieved      PT LONG TERM GOAL #4   Title  report 50% reduction in neck pain for improved function    Status  Achieved      PT LONG TERM GOAL #5   Title  Pt. will demonstrate cervical AROM WFL s symptoms to facilitate usual daily mobility, driving at PLOF s limitation.    Time  6    Period  Weeks    Status  New    Target Date  05/01/19             Plan - 03/20/19 0932    Clinical Impression Statement  Pt. has attended 8 visits overall during course of treatment.  Pt. has rated improvement at approx. 50% overall at this time.  See objective data for updated information.  Pt. has demonstrated improvements in strength and mobility but still presents c mild to moderate cervical, Lt knee pains that can impair daily activity, particularily lifting/carrying, stairs.  Pt. may continue to benefit from skilled PT services to facilitate return to PLOF and reaching established goals.    Personal Factors and Comorbidities  Fitness;Other   multiple treatment areas   Examination-Activity Limitations  Locomotion Level;Transfers;Reach Overhead;Stand;Stairs;Lift    Examination-Participation Restrictions  Meal Prep;Community Activity;Other    Stability/Clinical Decision Making  Evolving/Moderate complexity    Clinical Decision Making  Moderate  Rehab Potential  Good    PT Frequency  2x / week    PT Duration  6 weeks    PT Treatment/Interventions  ADLs/Self Care Home  Management;Cryotherapy;Electrical Stimulation;Ultrasound;Moist Heat;Iontophoresis '4mg'$ /ml Dexamethasone;Gait training;Stair training;Functional mobility training;Therapeutic activities;Therapeutic exercise;Balance training;Patient/family education;Neuromuscular re-education;Manual techniques;Vasopneumatic Device;Taping;Dry needling    PT Next Visit Plan  Continue DN prn, improve thoracic/cervical mobility, improve Lt LE WB strength/control.    PT Home Exercise Plan  Access Code: [email or web address]    Consulted and Agree with Plan of Care  Patient       Patient will benefit from skilled therapeutic intervention in order to improve the following deficits and impairments:  Abnormal gait, Increased fascial restricitons, Increased muscle spasms, Postural dysfunction, Pain, Decreased mobility, Decreased range of motion, Decreased strength, Impaired flexibility, Difficulty walking, Decreased balance, Decreased activity tolerance, Increased edema  Visit Diagnosis: Cervicalgia  Acute pain of left knee  Muscle weakness (generalized)  Abnormal posture  Other symptoms and signs involving the musculoskeletal system     Problem List There are no problems to display for this patient.  PHYSICAL THERAPY DISCHARGE SUMMARY  Visits from Start of Care: 8  Current functional level related to goals / functional outcomes: See note, rated 50% improvement   Remaining deficits: See note   Education / Equipment: HEP Plan:                                                    Patient goals were partially met. Patient is being discharged due to not returning since the last visit.  ?????     Scot Jun, PT, DPT, OCS, ATC 06/09/19  4:25 PM      [facility] [address] [place], [place], [address] Phone: [phone]   Fax:  [phone]  Name: Tsering Leaman MRN: [id number] Date of Birth: [birth year]

## 2019-06-02 ENCOUNTER — Other Ambulatory Visit: Payer: Self-pay

## 2019-06-02 ENCOUNTER — Encounter: Payer: Self-pay | Admitting: Emergency Medicine

## 2019-06-02 ENCOUNTER — Ambulatory Visit (INDEPENDENT_AMBULATORY_CARE_PROVIDER_SITE_OTHER): Payer: Self-pay | Admitting: Emergency Medicine

## 2019-06-02 VITALS — BP 121/75 | HR 82 | Temp 97.4°F | Resp 16 | Ht 66.0 in | Wt 237.0 lb

## 2019-06-02 DIAGNOSIS — Z8709 Personal history of other diseases of the respiratory system: Secondary | ICD-10-CM

## 2019-06-02 DIAGNOSIS — E039 Hypothyroidism, unspecified: Secondary | ICD-10-CM

## 2019-06-02 MED ORDER — ALBUTEROL SULFATE HFA 108 (90 BASE) MCG/ACT IN AERS: 2.0000 | INHALATION_SPRAY | Freq: Four times a day (QID) | RESPIRATORY_TRACT | 5 refills | Status: AC | PRN

## 2019-06-02 MED ORDER — LEVOTHYROXINE SODIUM 100 MCG PO TABS: 100.0000 ug | ORAL_TABLET | Freq: Every day | ORAL | 3 refills | Status: DC
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
## 2019-06-02 NOTE — Patient Instructions (Addendum)
   If you have lab work done today you will be contacted with your lab results within the next 2 weeks.  If you have not heard from us then please contact us. The fastest way to get your results is to register for My Chart.   IF you received an x-ray today, you will receive an invoice from [facility]. Please contact [facility] at [phone] with questions or concerns regarding your invoice.   IF you received labwork today, you will receive an invoice from [facility]. Please contact [facility] at [phone] with questions or concerns regarding your invoice.   Our billing staff will not be able to assist you with questions regarding bills from these companies.  You will be contacted with the lab results as soon as they are available. The fastest way to get your results is to activate your My Chart account. Instructions are located on the last page of this paperwork. If you have not heard from us regarding the results in 2 weeks, please contact this office.       Health Maintenance, Female Adopting a healthy lifestyle and getting preventive care are important in promoting health and wellness. Ask your health care provider about:  The right schedule for you to have regular tests and exams.  Things you can do on your own to prevent diseases and keep yourself healthy. What should I know about diet, weight, and exercise? Eat a healthy diet   Eat a diet that includes plenty of vegetables, fruits, low-fat dairy products, and lean protein.  Do not eat a lot of foods that are high in solid fats, added sugars, or sodium. Maintain a healthy weight Body mass index (BMI) is used to identify weight problems. It estimates body fat based on height and weight. Your health care provider can help determine your BMI and help you achieve or maintain a healthy weight. Get regular exercise Get regular exercise. This is one of the most important things you can do for your health. Most  adults should:  Exercise for at least 150 minutes each week. The exercise should increase your heart rate and make you sweat (moderate-intensity exercise).  Do strengthening exercises at least twice a week. This is in addition to the moderate-intensity exercise.  Spend less time sitting. Even light physical activity can be beneficial. Watch cholesterol and blood lipids Have your blood tested for lipids and cholesterol at 61 years of age, then have this test every 5 years. Have your cholesterol levels checked more often if:  Your lipid or cholesterol levels are high.  You are older than 61 years of age.  You are at high risk for heart disease. What should I know about cancer screening? Depending on your health history and family history, you may need to have cancer screening at various ages. This may include screening for:  Breast cancer.  Cervical cancer.  Colorectal cancer.  Skin cancer.  Lung cancer. What should I know about heart disease, diabetes, and high blood pressure? Blood pressure and heart disease  High blood pressure causes heart disease and increases the risk of stroke. This is more likely to develop in people who have high blood pressure readings, are of African descent, or are overweight.  Have your blood pressure checked: ? Every 3-5 years if you are 18-39 years of age. ? Every year if you are 40 years old or older. Diabetes Have regular diabetes screenings. This checks your fasting blood sugar level. Have the screening done:  Once   every three years after age 40 if you are at a normal weight and have a low risk for diabetes.  More often and at a younger age if you are overweight or have a high risk for diabetes. What should I know about preventing infection? Hepatitis B If you have a higher risk for hepatitis B, you should be screened for this virus. Talk with your health care provider to find out if you are at risk for hepatitis B infection. Hepatitis  C Testing is recommended for:  Everyone born from 1945 through 1965.  Anyone with known risk factors for hepatitis C. Sexually transmitted infections (STIs)  Get screened for STIs, including gonorrhea and chlamydia, if: ? You are sexually active and are younger than 61 years of age. ? You are older than 61 years of age and your health care provider tells you that you are at risk for this type of infection. ? Your sexual activity has changed since you were last screened, and you are at increased risk for chlamydia or gonorrhea. Ask your health care provider if you are at risk.  Ask your health care provider about whether you are at high risk for HIV. Your health care provider may recommend a prescription medicine to help prevent HIV infection. If you choose to take medicine to prevent HIV, you should first get tested for HIV. You should then be tested every 3 months for as long as you are taking the medicine. Pregnancy  If you are about to stop having your period (premenopausal) and you may become pregnant, seek counseling before you get pregnant.  Take 400 to 800 micrograms (mcg) of folic acid every day if you become pregnant.  Ask for birth control (contraception) if you want to prevent pregnancy. Osteoporosis and menopause Osteoporosis is a disease in which the bones lose minerals and strength with aging. This can result in bone fractures. If you are 65 years old or older, or if you are at risk for osteoporosis and fractures, ask your health care provider if you should:  Be screened for bone loss.  Take a calcium or vitamin D supplement to lower your risk of fractures.  Be given hormone replacement therapy (HRT) to treat symptoms of menopause. Follow these instructions at home: Lifestyle  Do not use any products that contain nicotine or tobacco, such as cigarettes, e-cigarettes, and chewing tobacco. If you need help quitting, ask your health care provider.  Do not use street  drugs.  Do not share needles.  Ask your health care provider for help if you need support or information about quitting drugs. Alcohol use  Do not drink alcohol if: ? Your health care provider tells you not to drink. ? You are pregnant, may be pregnant, or are planning to become pregnant.  If you drink alcohol: ? Limit how much you use to 0-1 drink a day. ? Limit intake if you are breastfeeding.  Be aware of how much alcohol is in your drink. In the [place] one drink equals one 12 oz bottle of beer (355 mL), one 5 oz glass of wine (148 mL), or one 1 oz glass of hard liquor (44 mL). General instructions  Schedule regular health, dental, and eye exams.  Stay current with your vaccines.  Tell your health care provider if: ? You often feel depressed. ? You have ever been abused or do not feel safe at home. Summary  Adopting a healthy lifestyle and getting preventive care are important in promoting health and   wellness.  Follow your health care provider's instructions about healthy diet, exercising, and getting tested or screened for diseases.  Follow your health care provider's instructions on monitoring your cholesterol and blood pressure. This information is not intended to replace advice given to you by your health care provider. Make sure you discuss any questions you have with your health care provider. Document Revised: 12/25/2017 Document Reviewed: 12/25/2017 Elsevier Patient Education  2020 Elsevier Inc.  

## 2019-06-24 ENCOUNTER — Ambulatory Visit: Payer: BC Managed Care – PPO | Admitting: Emergency Medicine

## 2019-06-29 ENCOUNTER — Other Ambulatory Visit: Payer: BC Managed Care – PPO

## 2019-06-29 ENCOUNTER — Other Ambulatory Visit: Payer: Self-pay

## 2019-06-29 ENCOUNTER — Telehealth: Payer: Self-pay | Admitting: Endocrinology

## 2019-06-29 DIAGNOSIS — E1165 Type 2 diabetes mellitus with hyperglycemia: Secondary | ICD-10-CM

## 2019-06-29 DIAGNOSIS — E063 Autoimmune thyroiditis: Secondary | ICD-10-CM

## 2019-06-29 NOTE — Telephone Encounter (Signed)
Please change total T4 to free T4

## 2019-06-29 NOTE — Telephone Encounter (Signed)
This has been done and labs will be faxed.

## 2019-06-29 NOTE — Telephone Encounter (Signed)
Patient requests Lab Orders be sent to [facility] at Fax# [phone] (patient going through a [facility] Program-"LAP" -confirmation of Lab Appt on 07/01/19 at 10:30 a.m.  Is [id number]), [facility] on [address]-ph# [phone].

## 2019-06-29 NOTE — Telephone Encounter (Signed)
Labs have been reordered and the resulting agency set to be [facility]. Sent to MD to sign off on labs.

## 2019-06-29 NOTE — Telephone Encounter (Signed)
[employer] and got fax number for [facility] Location at [phone]. Fax number listed below is the fax number to this office.

## 2019-07-01 ENCOUNTER — Other Ambulatory Visit: Payer: Self-pay | Admitting: Endocrinology

## 2019-07-02 LAB — T4, FREE: Free T4: 1.31 ng/dL (ref 0.82–1.77)

## 2019-07-02 LAB — TSH: TSH: 4.37 u[IU]/mL (ref 0.450–4.500)

## 2019-07-03 ENCOUNTER — Ambulatory Visit: Payer: BC Managed Care – PPO | Admitting: Endocrinology

## 2019-07-08 ENCOUNTER — Telehealth: Payer: Self-pay | Admitting: Endocrinology

## 2019-07-08 ENCOUNTER — Other Ambulatory Visit: Payer: Self-pay

## 2019-07-08 NOTE — Telephone Encounter (Signed)
Patient was called and given MD message. She verbalized understanding and she was scheduled for f/u in October.  Boyd Kerbs, would you please release the orders that have already been ordered so [facility] can view them when needed.

## 2019-07-08 NOTE — Telephone Encounter (Signed)
She was supposed to see me in follow-up since it is 4 months.  We can do a virtual visit if needed.  For now continue same dose

## 2019-07-08 NOTE — Telephone Encounter (Signed)
Patient called to see if her lab results were in and she wanted to let Dr know she is out of her Levothyroxine but wanted to know her results before requesting Rx.

## 2019-07-08 NOTE — Telephone Encounter (Signed)
Please advise 

## 2019-07-29 ENCOUNTER — Other Ambulatory Visit: Payer: Self-pay

## 2019-07-29 ENCOUNTER — Encounter: Payer: Self-pay | Admitting: Emergency Medicine

## 2019-07-29 ENCOUNTER — Telehealth (INDEPENDENT_AMBULATORY_CARE_PROVIDER_SITE_OTHER): Payer: Self-pay | Admitting: Emergency Medicine

## 2019-07-29 DIAGNOSIS — J22 Unspecified acute lower respiratory infection: Secondary | ICD-10-CM

## 2019-07-29 DIAGNOSIS — R062 Wheezing: Secondary | ICD-10-CM

## 2019-07-29 DIAGNOSIS — R059 Cough, unspecified: Secondary | ICD-10-CM

## 2019-07-29 DIAGNOSIS — Z8709 Personal history of other diseases of the respiratory system: Secondary | ICD-10-CM

## 2019-07-29 DIAGNOSIS — R05 Cough: Secondary | ICD-10-CM

## 2019-07-29 MED ORDER — AZITHROMYCIN 250 MG PO TABS: ORAL_TABLET | ORAL | 0 refills | Status: DC

## 2019-07-29 MED ORDER — BENZONATATE 200 MG PO CAPS: 200.0000 mg | ORAL_CAPSULE | Freq: Two times a day (BID) | ORAL | 0 refills | Status: DC | PRN

## 2019-07-29 MED ORDER — HYDROCODONE-HOMATROPINE 5-1.5 MG/5ML PO SYRP: 5.0000 mL | ORAL_SOLUTION | Freq: Every evening | ORAL | 0 refills | Status: DC | PRN

## 2019-07-29 MED ORDER — PREDNISONE 20 MG PO TABS: 40.0000 mg | ORAL_TABLET | Freq: Every day | ORAL | 0 refills | Status: AC

## 2019-07-29 NOTE — Patient Instructions (Signed)
° ° ° °  If you have lab work done today you will be contacted with your lab results within the next 2 weeks.  If you have not heard from us then please contact us. The fastest way to get your results is to register for My Chart. ° ° °IF you received an x-ray today, you will receive an invoice from [facility]. Please contact [facility] at [phone] with questions or concerns regarding your invoice.  ° °IF you received labwork today, you will receive an invoice from [facility]. Please contact [facility] at [phone] with questions or concerns regarding your invoice.  ° °Our billing staff will not be able to assist you with questions regarding bills from these companies. ° °You will be contacted with the lab results as soon as they are available. The fastest way to get your results is to activate your My Chart account. Instructions are located on the last page of this paperwork. If you have not heard from us regarding the results in 2 weeks, please contact this office. °  ° ° ° °

## 2019-07-29 NOTE — Progress Notes (Signed)
Telemedicine Encounter- SOAP NOTE Established Patient  This telephone encounter was conducted with the patient's (or proxy's) verbal consent via audio telecommunications: yes/no: Yes Patient was instructed to have this encounter in a suitably private space; and to only have persons present to whom they give permission to participate. In addition, patient identity was confirmed by use of name plus two identifiers (DOB and address).  I discussed the limitations, risks, security and privacy concerns of performing an evaluation and management service by telephone and the availability of in person appointments. I also discussed with the patient that there may be a patient responsible charge related to this service. The patient expressed understanding and agreed to proceed.  I spent a total of TIME; 0 MIN TO 60 MIN: 20 minutes talking with the patient or their proxy.  Chief Complaint  Patient presents with  . Cough    x12-15 days, clear phlegm chest congestion, headaches & nasal congestion, hx of asthma, has taken dayquil, nyquil and cough medicine                                                 Subjective   Stacey Black is a 58 y.o. female established patient. Telephone visit today complaining of flulike symptoms that started about 2 weeks ago progressively getting worse now complaining of productive cough, wheezing, chest congestion.  Has a history of asthma.  Has been taking Mucinex, DayQuil and NyQuil.  No other significant symptoms.  HPI   There are no problems to display for this patient.   Past Medical History:  Diagnosis Date  . Allergy   . Asthma    as a child  . Thyroid disease     Current Outpatient Medications  Medication Sig Dispense Refill  . Active 1st Blood Lancets 30G MISC by Does not apply route.    Marland Kitchen albuterol (VENTOLIN HFA) 108 (90 Base) MCG/ACT inhaler Inhale 2 puffs into the lungs every 6 (six) hours as needed for wheezing or shortness of breath. 18 g 5  .  Ascorbic Acid (VITAMIN C) 1000 MG tablet Take 1,000 mg by mouth daily.    . Cholecalciferol (VITAMIN D3 PO) Take 5,000 mg by mouth once a week.    Marland Kitchen ibuprofen (ADVIL) 600 MG tablet Take 1 tablet (600 mg total) by mouth every 6 (six) hours as needed. 30 tablet 0  . levothyroxine (SYNTHROID) 100 MCG tablet Take 1 tablet (100 mcg total) by mouth daily. 90 tablet 3  . Multiple Vitamin (MULTIVITAMIN) tablet Take 1 tablet by mouth daily.    Marland Kitchen OVER THE COUNTER MEDICATION     . OVER THE COUNTER MEDICATION     . Probiotic Product (PROBIOTIC DAILY PO) Take by mouth.    . pyridOXINE (VITAMIN B-6) 100 MG tablet Take 100 mg by mouth once a week.    . Zinc 100 MG TABS Take by mouth once a week.     No current facility-administered medications for this visit.    Allergies  Allergen Reactions  . Penicillins Swelling  . Bee Venom Swelling    And animals, seasonal pollen    Social History   Socioeconomic History  . Marital status: Married    Spouse name: Not on file  . Number of children: Not on file  . Years of education: Not on file  . Highest education  level: Not on file  Occupational History  . Not on file  Tobacco Use  . Smoking status: Never Smoker  . Smokeless tobacco: Never Used  Substance and Sexual Activity  . Alcohol use: Yes    Alcohol/week: 3.0 standard drinks    Types: 1 Glasses of wine, 1 Cans of beer, 1 Shots of liquor per week  . Drug use: No  . Sexual activity: Not on file  Other Topics Concern  . Not on file  Social History Narrative  . Not on file   Social Determinants of Health   Financial Resource Strain:   . Difficulty of Paying Living Expenses:   Food Insecurity:   . Worried About Programme researcher, broadcasting/film/video in the Last Year:   . Barista in the Last Year:   Transportation Needs:   . Freight forwarder (Medical):   Marland Kitchen Lack of Transportation (Non-Medical):   Physical Activity:   . Days of Exercise per Week:   . Minutes of Exercise per Session:     Stress:   . Feeling of Stress :   Social Connections:   . Frequency of Communication with Friends and Family:   . Frequency of Social Gatherings with Friends and Family:   . Attends Religious Services:   . Active Member of Clubs or Organizations:   . Attends Banker Meetings:   Marland Kitchen Marital Status:   Intimate Partner Violence:   . Fear of Current or Ex-Partner:   . Emotionally Abused:   Marland Kitchen Physically Abused:   . Sexually Abused:     Review of Systems  Constitutional: Negative.  Negative for chills and fever.  HENT: Positive for congestion and sore throat.   Respiratory: Positive for cough, sputum production and wheezing. Negative for hemoptysis and shortness of breath.   Cardiovascular: Negative.  Negative for chest pain and palpitations.  Gastrointestinal: Negative for abdominal pain, diarrhea, nausea and vomiting.  Genitourinary: Negative.  Negative for dysuria and hematuria.  Musculoskeletal: Negative for back pain, myalgias and neck pain.  Skin: Negative.  Negative for rash.  Neurological: Negative for dizziness and headaches.  All other systems reviewed and are negative.   Objective  Alert and oriented x3 no apparent respiratory distress Vitals as reported by the patient: There were no vitals filed for this visit.  There are no diagnoses linked to this encounter. Stacey Black was seen today for cough.  Diagnoses and all orders for this visit:  Cough -     benzonatate (TESSALON) 200 MG capsule; Take 1 capsule (200 mg total) by mouth 2 (two) times daily as needed for cough. -     HYDROcodone-homatropine (HYCODAN) 5-1.5 MG/5ML syrup; Take 5 mLs by mouth at bedtime as needed for cough.  Lower respiratory infection -     azithromycin (ZITHROMAX) 250 MG tablet; Sig as indicated  History of asthma  Wheezing -     predniSONE (DELTASONE) 20 MG tablet; Take 2 tablets (40 mg total) by mouth daily with breakfast for 5 days.     I discussed the assessment and  treatment plan with the patient. The patient was provided an opportunity to ask questions and all were answered. The patient agreed with the plan and demonstrated an understanding of the instructions.   The patient was advised to call back or seek an in-person evaluation if the symptoms worsen or if the condition fails to improve as anticipated.  I provided 20 minutes of non-face-to-face time during this encounter.  [facility]  Alvy Bimler, MD  Primary Care at [facility]

## 2019-10-22 ENCOUNTER — Telehealth: Payer: Self-pay | Admitting: Endocrinology

## 2019-10-22 NOTE — Telephone Encounter (Signed)
Patient called to request that Dr Lucianne Muss send her labs to the [facility] office located on [address] [place].  She has an appointment on Wednesday 11/28/19 with Dr Lucianne Muss.  She is hoping to have labs done tomorrow 11/23/19  Call back number for any clarification [phone]

## 2019-10-23 ENCOUNTER — Other Ambulatory Visit: Payer: Self-pay | Admitting: Endocrinology

## 2019-10-23 DIAGNOSIS — E063 Autoimmune thyroiditis: Secondary | ICD-10-CM

## 2019-10-24 LAB — T4, FREE: Free T4: 1.24 ng/dL (ref 0.82–1.77)

## 2019-10-24 LAB — TSH: TSH: 2.1 u[IU]/mL (ref 0.450–4.500)

## 2019-10-24 NOTE — Telephone Encounter (Signed)
Orders had been placed

## 2019-10-28 ENCOUNTER — Ambulatory Visit (INDEPENDENT_AMBULATORY_CARE_PROVIDER_SITE_OTHER): Payer: Self-pay | Admitting: Endocrinology

## 2019-10-28 ENCOUNTER — Telehealth: Payer: Self-pay | Admitting: Endocrinology

## 2019-10-28 ENCOUNTER — Other Ambulatory Visit: Payer: Self-pay

## 2019-10-28 ENCOUNTER — Encounter: Payer: Self-pay | Admitting: Endocrinology

## 2019-10-28 VITALS — BP 124/80 | HR 77 | Wt 232.6 lb

## 2019-10-28 DIAGNOSIS — E063 Autoimmune thyroiditis: Secondary | ICD-10-CM

## 2019-10-28 NOTE — Telephone Encounter (Signed)
Patient requests that Lab Orders for follow up appointment on 04/28/19 be sent 1 week in advance to [facility] located at [address], [address] Fax# [phone]

## 2019-10-28 NOTE — Progress Notes (Signed)
Patient ID: Stacey Black, female   DOB: 1960-09-26, 59 y.o.   MRN: [id number]            Referring Provider: Starr Lake  Reason for Appointment:  Hypothyroidism, new visit    History of Present Illness:   Hypothyroidism was first diagnosed in 1989  Previous history or details are not available but likely the patient had issues with fatigue She was also having more issues after her pregnancies        The patient has been treated previously with mostly brand-name Synthroid up to 200 mcg daily    She did not have any consistent follow-up for her thyroid treatment and stopped taking her Synthroid in 2013 Apparently did not feel any worse after stopping this  In 12/20 she  was having symptoms of fatigue which however has been going on for quite some time and she had blamed it on taking care of multiple family members as well as working She was having some issues with losing some hair and dry skin  Her PCP empirically started her on 100 mcg of levothyroxine when her TSH was 4.9 Free thyroxine index was normal at 1.9  Recent history:  With taking levothyroxine 100 mcg daily since 12/20 she has had less fatigue, somewhat better skin and less hair loss She did have a small thyroid enlargement on the left at baseline  Her thyroid levels were normal on her initial consultation in 2/21 Also she had labs done in 6/21 which showed normal TSH G she did not come for her office visit  She has taken her levothyroxine regularly and is getting the [employer] product from [facility] Has been taking it on empty stomach TSH is again fairly good at 2.1         Patient's weight history is as follows:  Wt Readings from Last 3 Encounters:  10/28/19 232 lb 9.6 oz (105.5 kg)  06/02/19 237 lb (107.5 kg)  03/02/19 233 lb 9.6 oz (106 kg)    Thyroid function results have been as follows:  Lab Results  Component Value Date   TSH 4.370 07/01/2019   TSH 2.02 03/02/2019   TSH 4.900 (H) 12/24/2018    FREET4 1.31 07/01/2019   FREET4 0.92 03/02/2019   T3FREE 3.5 03/02/2019     Past Medical History:  Diagnosis Date  . Allergy   . Asthma    as a child  . Thyroid disease     Past Surgical History:  Procedure Laterality Date  . ABDOMINAL HYSTERECTOMY     2000    Family History  Problem Relation Age of Onset  . Arthritis Mother   . Asthma Mother   . Vision loss Mother   . Hypothyroidism Mother   . Diabetes Neg Hx     Social History:  reports that she has never smoked. She has never used smokeless tobacco. She reports current alcohol use of about 3.0 standard drinks of alcohol per week. She reports that she does not use drugs.  Allergies:  Allergies  Allergen Reactions  . Penicillins Swelling  . Bee Venom Swelling    And animals, seasonal pollen    Allergies as of 10/28/2019      Reactions   Penicillins Swelling   Bee Venom Swelling   And animals, seasonal pollen      Medication List       Accurate as of October 28, 2019  9:12 AM. If you have any questions, ask your nurse or doctor.  Active 1st Blood Lancets 30G Misc by Does not apply route.   albuterol 108 (90 Base) MCG/ACT inhaler Commonly known as: VENTOLIN HFA Inhale 2 puffs into the lungs every 6 (six) hours as needed for wheezing or shortness of breath.   azithromycin 250 MG tablet Commonly known as: ZITHROMAX Sig as indicated   benzonatate 200 MG capsule Commonly known as: TESSALON Take 1 capsule (200 mg total) by mouth 2 (two) times daily as needed for cough.   HYDROcodone-homatropine 5-1.5 MG/5ML syrup Commonly known as: HYCODAN Take 5 mLs by mouth at bedtime as needed for cough.   ibuprofen 600 MG tablet Commonly known as: ADVIL Take 1 tablet (600 mg total) by mouth every 6 (six) hours as needed.   levothyroxine 100 MCG tablet Commonly known as: SYNTHROID Take 1 tablet (100 mcg total) by mouth daily.   multivitamin tablet Take 1 tablet by mouth daily.   OVER THE COUNTER  MEDICATION   OVER THE COUNTER MEDICATION   PROBIOTIC DAILY PO Take by mouth.   pyridOXINE 100 MG tablet Commonly known as: VITAMIN B-6 Take 100 mg by mouth once a week.   vitamin C 1000 MG tablet Take 1,000 mg by mouth daily.   VITAMIN D3 PO Take 5,000 mg by mouth once a week.   Zinc 100 MG Tabs Take by mouth once a week.          Review of Systems Weight history:  Wt Readings from Last 3 Encounters:  10/28/19 232 lb 9.6 oz (105.5 kg)  06/02/19 237 lb (107.5 kg)  03/02/19 233 lb 9.6 oz (106 kg)                Examination:    BP 124/80   Pulse 77   Wt 232 lb 9.6 oz (105.5 kg)   SpO2 98%   BMI 37.54 kg/m    THYROID: Not enlarged, only minimal fullness on the left side  Bicep reflexes appear normal   Assessment:  HYPOTHYROIDISM, mild with TSH 4.9 Although she has been taking a relatively high dose of 100 mcg for the relatively mild hypothyroidism her TSH levels on treatment are quite normal and now excellent at 2.1 She is subjectively doing well also Her goiter is not palpable now  PLAN:  Stay on levothyroxine 100 mcg and take every morning on empty stomach  Discussed continuing the same manufacturer for levothyroxine generics if possible Follow-up in 6 months   Stacey Black 10/28/2019, 9:12 AM     Note: This office note was prepared with Dragon voice recognition system technology. Any transcriptional errors that result from this process are unintentional.

## 2020-01-20 ENCOUNTER — Other Ambulatory Visit: Payer: Self-pay | Admitting: Emergency Medicine

## 2020-01-20 DIAGNOSIS — E039 Hypothyroidism, unspecified: Secondary | ICD-10-CM

## 2020-04-07 ENCOUNTER — Encounter: Payer: Self-pay | Admitting: Emergency Medicine

## 2020-04-07 ENCOUNTER — Telehealth (INDEPENDENT_AMBULATORY_CARE_PROVIDER_SITE_OTHER): Payer: Self-pay | Admitting: Emergency Medicine

## 2020-04-07 ENCOUNTER — Other Ambulatory Visit: Payer: Self-pay

## 2020-04-07 VITALS — Ht 66.0 in | Wt 225.0 lb

## 2020-04-07 DIAGNOSIS — R059 Cough, unspecified: Secondary | ICD-10-CM

## 2020-04-07 DIAGNOSIS — J22 Unspecified acute lower respiratory infection: Secondary | ICD-10-CM

## 2020-04-07 MED ORDER — HYDROCODONE-HOMATROPINE 5-1.5 MG/5ML PO SYRP: 5.0000 mL | ORAL_SOLUTION | Freq: Every evening | ORAL | 0 refills | Status: DC | PRN

## 2020-04-07 MED ORDER — AZITHROMYCIN 250 MG PO TABS: ORAL_TABLET | ORAL | 0 refills | Status: DC

## 2020-04-07 MED ORDER — BENZONATATE 200 MG PO CAPS: 200.0000 mg | ORAL_CAPSULE | Freq: Two times a day (BID) | ORAL | 0 refills | Status: DC | PRN

## 2020-04-07 NOTE — Progress Notes (Signed)
Telemedicine Encounter- SOAP NOTE Established Patient Patient: Home  Provider: Office     This telephone encounter was conducted with the patient's (or proxy's) verbal consent via audio telecommunications: yes/no: Yes Patient was instructed to have this encounter in a suitably private space; and to only have persons present to whom they give permission to participate. In addition, patient identity was confirmed by use of name plus two identifiers (DOB and address).  I discussed the limitations, risks, security and privacy concerns of performing an evaluation and management service by telephone and the availability of in person appointments. I also discussed with the patient that there may be a patient responsible charge related to this service. The patient expressed understanding and agreed to proceed.  I spent a total of TIME; 0 MIN TO 60 MIN: 20 minutes talking with the patient or their proxy.  Chief Complaint  Patient presents with  . Cough    Per patient started 3 weeks ago with a fever of 100.7, bodyache  . Nasal Congestion    Subjective   Stacey Black is a 60 y.o. female established patient. Telephone visit today complaining of flulike symptoms for the past 3 weeks progressively getting worse.  Mostly complaining of productive cough with generalized achiness.  Denies fever or chills.  Denies difficulty breathing.  Denies chest pain.  Able to eat and drink.  Denies nausea or vomiting.  Denies abdominal pain or diarrhea. Several family members sick with the same. No other complaints or medical concerns today.     There are no problems to display for this patient.   Past Medical History:  Diagnosis Date  . Allergy   . Asthma    as a child  . Thyroid disease     Current Outpatient Medications  Medication Sig Dispense Refill  . albuterol (VENTOLIN HFA) 108 (90 Base) MCG/ACT inhaler Inhale 2 puffs into the lungs every 6 (six) hours as needed for wheezing or shortness  of breath. 18 g 5  . Ascorbic Acid (VITAMIN C) 1000 MG tablet Take 1,000 mg by mouth daily.    . Cholecalciferol (VITAMIN D3 PO) Take 5,000 mg by mouth once a week.    Marland Kitchen ibuprofen (ADVIL) 600 MG tablet Take 1 tablet (600 mg total) by mouth every 6 (six) hours as needed. 30 tablet 0  . levothyroxine (SYNTHROID) 100 MCG tablet TAKE 1 TABLET BY MOUTH EVERY DAY 90 tablet 0  . Multiple Vitamin (MULTIVITAMIN) tablet Take 1 tablet by mouth daily.    Marland Kitchen OVER THE COUNTER MEDICATION     . OVER THE COUNTER MEDICATION     . Probiotic Product (PROBIOTIC DAILY PO) Take by mouth.    . Pseudoeph-Doxylamine-DM-APAP (NYQUIL PO) Take by mouth at bedtime.    . Pseudoephedrine-APAP-DM (DAYQUIL PO) Take by mouth in the morning and at bedtime.    . pyridOXINE (VITAMIN B-6) 100 MG tablet Take 100 mg by mouth once a week.    . Zinc 100 MG TABS Take by mouth once a week.    . Active 1st Blood Lancets 30G MISC by Does not apply route.    Marland Kitchen HYDROcodone-homatropine (HYCODAN) 5-1.5 MG/5ML syrup Take 5 mLs by mouth at bedtime as needed for cough. (Patient not taking: Reported on 04/07/2020) 120 mL 0   No current facility-administered medications for this visit.    Allergies  Allergen Reactions  . Penicillins Swelling  . Bee Venom Swelling    And animals, seasonal pollen    Social History  Socioeconomic History  . Marital status: Married    Spouse name: Not on file  . Number of children: Not on file  . Years of education: Not on file  . Highest education level: Not on file  Occupational History  . Not on file  Tobacco Use  . Smoking status: Never Smoker  . Smokeless tobacco: Never Used  Substance and Sexual Activity  . Alcohol use: Yes    Alcohol/week: 3.0 standard drinks    Types: 1 Glasses of wine, 1 Cans of beer, 1 Shots of liquor per week  . Drug use: No  . Sexual activity: Not on file  Other Topics Concern  . Not on file  Social History Narrative  . Not on file   Social Determinants of Health    Financial Resource Strain: Not on file  Food Insecurity: Not on file  Transportation Needs: Not on file  Physical Activity: Not on file  Stress: Not on file  Social Connections: Not on file  Intimate Partner Violence: Not on file    Review of Systems  Constitutional: Negative.  Negative for chills, fever and malaise/fatigue.  HENT: Positive for congestion and sore throat.   Respiratory: Positive for cough and sputum production. Negative for hemoptysis, shortness of breath and wheezing.   Cardiovascular: Negative.  Negative for chest pain and palpitations.  Gastrointestinal: Negative.  Negative for abdominal pain, nausea and vomiting.  Genitourinary: Negative.  Negative for dysuria, flank pain and hematuria.  Skin: Negative.  Negative for rash.  Neurological: Negative.  Negative for dizziness, focal weakness, loss of consciousness and headaches.  All other systems reviewed and are negative.   Objective  Alert and oriented x3 in no apparent respiratory distress Vitals as reported by the patient: Today's Vitals   04/07/20 1700  Weight: 225 lb (102.1 kg)  Height: 5\' 6"  (1.676 m)    There are no diagnoses linked to this encounter. [name] was seen today for cough and nasal congestion.  Diagnoses and all orders for this visit:  Cough -     benzonatate (TESSALON) 200 MG capsule; Take 1 capsule (200 mg total) by mouth 2 (two) times daily as needed for cough. -     HYDROcodone-homatropine (HYCODAN) 5-1.5 MG/5ML syrup; Take 5 mLs by mouth at bedtime as needed for cough.  Lower respiratory infection -     azithromycin (ZITHROMAX) 250 MG tablet; Sig as indicated  Clinically stable.  No red flag signs or symptoms. Medications as prescribed.  Suspecting secondary bacterial infection after weeks of flulike symptoms. Advised to contact the office if no better or worse during the next several days.   I discussed the assessment and treatment plan with the patient. The patient was  provided an opportunity to ask questions and all were answered. The patient agreed with the plan and demonstrated an understanding of the instructions.   The patient was advised to call back or seek an in-person evaluation if the symptoms worsen or if the condition fails to improve as anticipated.  I provided 20 minutes of non-face-to-face time during this encounter.  Erskine Squibb, MD  Primary Care at [facility]

## 2020-04-08 ENCOUNTER — Telehealth: Payer: Self-pay | Admitting: Emergency Medicine

## 2020-04-08 NOTE — Telephone Encounter (Signed)
Hydrocodone rx has been faxed by cynthia on 04/07/20@1742  confirmation has been confirmed.

## 2020-04-12 ENCOUNTER — Other Ambulatory Visit: Payer: Self-pay | Admitting: *Deleted

## 2020-04-12 ENCOUNTER — Other Ambulatory Visit: Payer: Self-pay | Admitting: Emergency Medicine

## 2020-04-12 DIAGNOSIS — E039 Hypothyroidism, unspecified: Secondary | ICD-10-CM

## 2020-04-12 MED ORDER — LEVOTHYROXINE SODIUM 100 MCG PO TABS: 100.0000 ug | ORAL_TABLET | Freq: Every day | ORAL | 0 refills | Status: DC

## 2020-04-12 NOTE — Telephone Encounter (Signed)
Routing to CMA 

## 2020-04-12 NOTE — Telephone Encounter (Signed)
  Notes to clinic Not a provider we assess rx for, please assess.  

## 2020-04-25 ENCOUNTER — Other Ambulatory Visit: Payer: Self-pay | Admitting: Internal Medicine

## 2020-04-25 ENCOUNTER — Telehealth: Payer: Self-pay | Admitting: Endocrinology

## 2020-04-25 ENCOUNTER — Other Ambulatory Visit: Payer: Self-pay

## 2020-04-25 DIAGNOSIS — E063 Autoimmune thyroiditis: Secondary | ICD-10-CM

## 2020-04-25 NOTE — Telephone Encounter (Signed)
Patient requests that Lab Orders for follow up appointment on 04/27/20 be sent asap to [facility] located at [address], [address] Fax# [phone]  Patient requests to be called at ph# [phone] once lab orders have been sent so that Patient can get her prior labs done.

## 2020-04-25 NOTE — Telephone Encounter (Signed)
T, can you please fax the orders for a TSH and free T4 to [facility]?.  However, Lavone Neri, please let her know that most likely Dr. Lucianne Muss will be in touch with her about the results when he returns.

## 2020-04-25 NOTE — Telephone Encounter (Signed)
Patient called stating she has an appointment Wednesday with Dr Lucianne Muss and she's going to [facility] today to get labs done but when she called [facility] they said they did not have any orders for her.  Routing this message to Dr Elvera Lennox since she's on call to see if possibly can order and release labs for patient so she does not have to cancel her appointment on Wednesday.

## 2020-04-25 NOTE — Telephone Encounter (Signed)
Labs ordered.

## 2020-04-26 LAB — T4, FREE: Free T4: 1.34 ng/dL (ref 0.82–1.77)

## 2020-04-26 LAB — T4: T4, Total: 9 ug/dL (ref 4.5–12.0)

## 2020-04-26 LAB — TSH: TSH: 3.66 u[IU]/mL (ref 0.450–4.500)

## 2020-04-27 ENCOUNTER — Other Ambulatory Visit: Payer: Self-pay

## 2020-04-27 ENCOUNTER — Encounter: Payer: Self-pay | Admitting: Endocrinology

## 2020-04-27 ENCOUNTER — Ambulatory Visit (INDEPENDENT_AMBULATORY_CARE_PROVIDER_SITE_OTHER): Payer: Self-pay | Admitting: Endocrinology

## 2020-04-27 VITALS — BP 132/82 | HR 82 | Ht 66.0 in | Wt 231.0 lb

## 2020-04-27 DIAGNOSIS — E063 Autoimmune thyroiditis: Secondary | ICD-10-CM | POA: Insufficient documentation

## 2020-04-27 NOTE — Progress Notes (Signed)
Patient ID: Stacey Black, female   DOB: 17-May-1960, 60 y.o.   MRN: [id number]            Referring Provider: Starr Lake  Reason for Appointment:  Hypothyroidism, follow-up visit    History of Present Illness:   Hypothyroidism was first diagnosed in 1989  Previous history or details are not available but likely the patient had issues with fatigue She was also having more issues after her pregnancies        The patient has been treated previously with mostly brand-name Synthroid up to 200 mcg daily    She did not have any consistent follow-up for her thyroid treatment and stopped taking her Synthroid in 2013 Apparently did not feel any worse after stopping this  In 12/20 she  was having symptoms of fatigue which however has been going on for quite some time and she had blamed it on taking care of multiple family members as well as working She was having some issues with losing some hair and dry skin  Her PCP empirically started her on 100 mcg of levothyroxine when her TSH was 4.9 Free thyroxine index was normal at 1.9  Recent history:  With taking levothyroxine 100 mcg daily since 12/2018 she has had less fatigue, somewhat better skin and less hair loss She did have a small thyroid enlargement on the left at baseline  Recently has had no symptoms of tiredness, has mild hair loss but less than before Her weight has fluctuated somewhat  She is quite regular with taking the levothyroxine regularly every morning Has been getting the [employer] [facility]  TSH again in the normal range at 3.7         Patient's weight history is as follows:  Wt Readings from Last 3 Encounters:  04/27/20 231 lb (104.8 kg)  04/07/20 225 lb (102.1 kg)  10/28/19 232 lb 9.6 oz (105.5 kg)    Thyroid function results have been as follows:  Lab Results  Component Value Date   TSH 3.660 04/25/2020   TSH 2.100 10/23/2019   TSH 4.370 07/01/2019   TSH 2.02 03/02/2019   FREET4 1.34  04/25/2020   FREET4 1.24 10/23/2019   FREET4 1.31 07/01/2019   FREET4 0.92 03/02/2019   T3FREE 3.5 03/02/2019     Past Medical History:  Diagnosis Date  . Allergy   . Asthma    as a child  . Thyroid disease     Past Surgical History:  Procedure Laterality Date  . ABDOMINAL HYSTERECTOMY     2000    Family History  Problem Relation Age of Onset  . Arthritis Mother   . Asthma Mother   . Vision loss Mother   . Hypothyroidism Mother   . Diabetes Neg Hx     Social History:  reports that she has never smoked. She has never used smokeless tobacco. She reports current alcohol use of about 3.0 standard drinks of alcohol per week. She reports that she does not use drugs.  Allergies:  Allergies  Allergen Reactions  . Penicillins Swelling  . Bee Venom Swelling    And animals, seasonal pollen    Allergies as of 04/27/2020      Reactions   Penicillins Swelling   Bee Venom Swelling   And animals, seasonal pollen      Medication List       Accurate as of April 27, 2020  8:39 AM. If you have any questions, ask your nurse or  doctor.        Active 1st Blood Lancets 30G Misc by Does not apply route.   albuterol 108 (90 Base) MCG/ACT inhaler Commonly known as: VENTOLIN HFA Inhale 2 puffs into the lungs every 6 (six) hours as needed for wheezing or shortness of breath.   azithromycin 250 MG tablet Commonly known as: ZITHROMAX Sig as indicated   benzonatate 200 MG capsule Commonly known as: TESSALON Take 1 capsule (200 mg total) by mouth 2 (two) times daily as needed for cough.   DAYQUIL PO Take by mouth in the morning and at bedtime.   HYDROcodone-homatropine 5-1.5 MG/5ML syrup Commonly known as: HYCODAN Take 5 mLs by mouth at bedtime as needed for cough.   ibuprofen 600 MG tablet Commonly known as: ADVIL Take 1 tablet (600 mg total) by mouth every 6 (six) hours as needed.   levothyroxine 100 MCG tablet Commonly known as: SYNTHROID Take 1 tablet (100 mcg  total) by mouth daily.   multivitamin tablet Take 1 tablet by mouth daily.   NYQUIL PO Take by mouth at bedtime.   OVER THE COUNTER MEDICATION   OVER THE COUNTER MEDICATION   PROBIOTIC DAILY PO Take by mouth.   pyridOXINE 100 MG tablet Commonly known as: VITAMIN B-6 Take 100 mg by mouth once a week.   vitamin C 1000 MG tablet Take 1,000 mg by mouth daily.   VITAMIN D3 PO Take 5,000 mg by mouth once a week.   Zinc 100 MG Tabs Take by mouth once a week.          Review of Systems  Weight history:  Wt Readings from Last 3 Encounters:  04/27/20 231 lb (104.8 kg)  04/07/20 225 lb (102.1 kg)  10/28/19 232 lb 9.6 oz (105.5 kg)                Examination:    BP 132/82   Pulse 82   Ht 5\' 6"  (1.676 m)   Wt 231 lb (104.8 kg)   SpO2 96%   BMI 37.28 kg/m    THYROID: Not palpable on either side No obvious alopecia  Assessment:  HYPOTHYROIDISM, mild with TSH 4.9 at baseline  She had symptomatic improvement with taking levothyroxine supplements Also had mild thyroid enlargement at baseline  She continues to be on 100 mcg levothyroxine with normal TSH again  PLAN:  She will continue on levothyroxine 100 mcg every day as before She can now follow-up annually unless she has any unusual fatigue   04/27/2020, 8:39 AM     Note: This office note was prepared with Dragon voice recognition system technology. Any transcriptional errors that result from this process are unintentional.

## 2020-09-23 ENCOUNTER — Telehealth: Payer: Self-pay | Admitting: Orthopaedic Surgery

## 2020-09-23 NOTE — Telephone Encounter (Signed)
Pt sprained ankle and would like to be seen by another doctor on Monday if possible. Dr. Magnus Ivan and staff have nothing open until following week. Please advise . Pt phone number is [phone].

## 2020-10-12 ENCOUNTER — Encounter: Payer: Self-pay | Admitting: Orthopaedic Surgery

## 2020-10-12 ENCOUNTER — Ambulatory Visit (INDEPENDENT_AMBULATORY_CARE_PROVIDER_SITE_OTHER): Payer: Self-pay | Admitting: Orthopaedic Surgery

## 2020-10-12 ENCOUNTER — Ambulatory Visit: Payer: Self-pay

## 2020-10-12 DIAGNOSIS — M25572 Pain in left ankle and joints of left foot: Secondary | ICD-10-CM

## 2020-10-12 NOTE — Progress Notes (Signed)
The patient comes in today for evaluation treatment of left ankle pain.  She actually hyperextended her ankle sitting down about 6 weeks ago when one of her large dogs jumped on her foot and ankle.  She has tried just to walk off the pain and it does hurt to walk and she has been wearing compressive stockings due to continued swelling in the ankle.  Certain movements hurt worse than others.  She has been working on elevation and intermittent ice and heat.  This has not really helped.  She does take some ibuprofen as well.  On exam there is soft tissue swelling globally around the ankle but more on the lateral aspect.  Her ankles ligaments is stable with some limitations in motion due to stiffness.  Her foot is well-perfused and neurovascular intact.  3 views of the left ankle show no evidence of a fracture.  The ankle mortise is intact and well aligned.  There are some mild arthritic changes.  At this point this really looks like just a severe sprain.  I have recommended an ASO for the ankle and activities as tolerated.  If this does not improve after several weeks we can set her up for outpatient therapy for the ankle and even consider a MRI if things or not getting better.  All question concerns were answered and addressed.
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
## 2021-01-12 ENCOUNTER — Telehealth: Payer: Self-pay | Admitting: Endocrinology

## 2021-01-12 DIAGNOSIS — E039 Hypothyroidism, unspecified: Secondary | ICD-10-CM

## 2021-01-12 MED ORDER — LEVOTHYROXINE SODIUM 100 MCG PO TABS: 100.0000 ug | ORAL_TABLET | Freq: Every day | ORAL | 0 refills | Status: DC

## 2021-01-12 NOTE — Telephone Encounter (Signed)
MEDICATION: levothyroxine (SYNTHROID) 100 MCG tablet  PHARMACY:   [id number] - [place], [place] - [address] Phone:  [phone]  Fax:  [phone]      HAS THE PATIENT CONTACTED THEIR PHARMACY?  yes  IS THIS A 90 DAY SUPPLY : yes  IS PATIENT OUT OF MEDICATION: yes  IF NOT; HOW MUCH IS LEFT:   LAST APPOINTMENT DATE: @Visit  date not found  NEXT APPOINTMENT DATE:@4 /13/2023  DO WE HAVE YOUR PERMISSION TO LEAVE A DETAILED MESSAGE?:  OTHER COMMENTS: Patient has been out for 2 weeks. Please process refill ASAP.   **Let patient know to contact pharmacy at the end of the day to make sure medication is ready. **  ** Please notify patient to allow 48-72 hours to process**  **Encourage patient to contact the pharmacy for refills or they can request refills through [facility]**

## 2021-01-12 NOTE — Telephone Encounter (Signed)
Rx sent to preferred pharmacy.

## 2021-04-10 ENCOUNTER — Other Ambulatory Visit: Payer: Self-pay | Admitting: Endocrinology

## 2021-04-10 ENCOUNTER — Other Ambulatory Visit: Payer: Self-pay

## 2021-04-10 ENCOUNTER — Telehealth: Payer: Self-pay

## 2021-04-10 DIAGNOSIS — E039 Hypothyroidism, unspecified: Secondary | ICD-10-CM

## 2021-04-10 MED ORDER — LEVOTHYROXINE SODIUM 100 MCG PO TABS: 100.0000 ug | ORAL_TABLET | Freq: Every day | ORAL | 0 refills | Status: DC

## 2021-04-10 NOTE — Telephone Encounter (Signed)
Stacey Black has released ?

## 2021-04-10 NOTE — Telephone Encounter (Signed)
Patient has appt with you on 4/13 and is going to [facility] to have labs drawn. Please place lab orders for [facility]. ?

## 2021-04-24 ENCOUNTER — Other Ambulatory Visit: Payer: Self-pay

## 2021-04-24 ENCOUNTER — Telehealth: Payer: Self-pay

## 2021-04-24 DIAGNOSIS — E063 Autoimmune thyroiditis: Secondary | ICD-10-CM

## 2021-04-24 NOTE — Telephone Encounter (Signed)
Patient called requesting lab orders get faxed to [facility].  Orders faxed to [phone].  Patient informed. ?

## 2021-04-26 LAB — T4, FREE: Free T4: 1.4 ng/dL (ref 0.82–1.77)

## 2021-04-26 LAB — TSH: TSH: 2.98 u[IU]/mL (ref 0.450–4.500)

## 2021-04-27 ENCOUNTER — Ambulatory Visit: Payer: Self-pay | Admitting: Endocrinology

## 2021-05-05 ENCOUNTER — Ambulatory Visit: Payer: Self-pay | Admitting: Endocrinology

## 2021-05-09 ENCOUNTER — Encounter: Payer: Self-pay | Admitting: Endocrinology

## 2021-05-09 ENCOUNTER — Ambulatory Visit (INDEPENDENT_AMBULATORY_CARE_PROVIDER_SITE_OTHER): Payer: Self-pay | Admitting: Endocrinology

## 2021-05-09 VITALS — BP 130/90 | HR 82 | Ht 66.0 in | Wt 242.2 lb

## 2021-05-09 DIAGNOSIS — E063 Autoimmune thyroiditis: Secondary | ICD-10-CM

## 2021-05-09 NOTE — Progress Notes (Signed)
Patient ID: Stacey Black, female   DOB: [birth year], 61 y.o.   MRN: [id number] ? ?       ? ? ? ?Referring Provider: Jos? Sagardia ? ?Reason for Appointment:  Hypothyroidism, follow-up visit ? ? ? History of Present Illness:  ? ?Hypothyroidism was first diagnosed in 1989 ? ?Previous history or details are not available but likely the patient had baseline symptoms of fatigue ?She was also having more issues with her thyroid after her pregnancies       ? ?She had been treated previously with mostly brand-name Synthroid up to 200 mcg daily   ? ?She did not have any consistent follow-up for her thyroid treatment and stopped taking her Synthroid in 2013 ?Apparently did not feel any worse after stopping this ? ?In 12/20 she  was having symptoms of fatigue which however has been going on for quite some time and she had blamed it on taking care of multiple family members as well as working ?She was having some issues with losing some hair and dry skin ? ?Her PCP empirically started her on 100 mcg of levothyroxine when her TSH was 4.9 ?Free thyroxine index was normal at 1.9 ? ?Recent history: ? ?With taking levothyroxine 100 mcg daily since 12/2018 she has had less fatigue, somewhat better skin and less hair loss ?She did have a small thyroid enlargement on the left at baseline ? ?She is back for her annual follow-up ?Again has has had no symptoms of unusual fatigue ?She has however had gradual weight gain ? ?She is quite regular with taking the levothyroxine regularly before breakfast ? ?Her TSH is consistently normal and now about 3 ?        ?Patient's weight history is as follows: ? ?Wt Readings from Last 3 Encounters:  ?05/09/21 242 lb 3.2 oz (109.9 kg)  ?04/27/20 231 lb (104.8 kg)  ?04/07/20 225 lb (102.1 kg)  ? ? ?Thyroid function results have been as follows: ? ?Lab Results  ?Component Value Date  ? TSH 2.980 04/25/2021  ? TSH 3.660 04/25/2020  ? TSH 2.100 10/23/2019  ? TSH 4.370 07/01/2019  ? FREET4 1.40 04/25/2021   ? FREET4 1.34 04/25/2020  ? FREET4 1.24 10/23/2019  ? FREET4 1.31 07/01/2019  ? T3FREE 3.5 03/02/2019  ? ? ? ?Past Medical History:  ?Diagnosis Date  ? Allergy   ? Asthma   ? as a child  ? Thyroid disease   ? ? ?Past Surgical History:  ?Procedure Laterality Date  ? ABDOMINAL HYSTERECTOMY    ? 2000  ? ? ?Family History  ?Problem Relation Age of Onset  ? Arthritis Mother   ? Asthma Mother   ? Vision loss Mother   ? Hypothyroidism Mother   ? Diabetes Neg Hx   ? ? ?Social History:  reports that she has never smoked. She has never used smokeless tobacco. She reports current alcohol use of about 3.0 standard drinks per week. She reports that she does not use drugs. ? ?Allergies:  ?Allergies  ?Allergen Reactions  ? Penicillins Swelling  ? Bee Venom Swelling  ?  And animals, seasonal pollen  ? ? ?Allergies as of 05/09/2021   ? ?   Reactions  ? Penicillins Swelling  ? Bee Venom Swelling  ? And animals, seasonal pollen  ? ?  ? ?  ?Medication List  ?  ? ?  ? Accurate as of May 09, 2021 10:04 AM. If you have any questions, ask your nurse or doctor.  ?  ?  ? ?  ? ?  STOP taking these medications   ? ?Active 1st Blood Lancets 30G Misc ?Stopped by: Reather Littler, MD ?  ? ?  ? ?TAKE these medications   ? ?albuterol 108 (90 Base) MCG/ACT inhaler ?Commonly known as: VENTOLIN HFA ?Inhale 2 puffs into the lungs every 6 (six) hours as needed for wheezing or shortness of breath. ?  ?azithromycin 250 MG tablet ?Commonly known as: ZITHROMAX ?Sig as indicated ?  ?benzonatate 200 MG capsule ?Commonly known as: TESSALON ?Take 1 capsule (200 mg total) by mouth 2 (two) times daily as needed for cough. ?  ?ciprofloxacin 0.3 % ophthalmic solution ?Commonly known as: CILOXAN ?SMARTSIG:In Eye(s) ?  ?DAYQUIL PO ?Take by mouth in the morning and at bedtime. ?  ?HYDROcodone-homatropine 5-1.5 MG/5ML syrup ?Commonly known as: HYCODAN ?Take 5 mLs by mouth at bedtime as needed for cough. ?  ?ibuprofen 600 MG tablet ?Commonly known as: ADVIL ?Take 1 tablet  (600 mg total) by mouth every 6 (six) hours as needed. ?  ?levothyroxine 100 MCG tablet ?Commonly known as: SYNTHROID ?Take 1 tablet (100 mcg total) by mouth daily. ?  ?multivitamin tablet ?Take 1 tablet by mouth daily. ?  ?NYQUIL PO ?Take by mouth at bedtime. ?  ?OVER THE COUNTER MEDICATION ?  ?OVER THE COUNTER MEDICATION ?  ?PROBIOTIC DAILY PO ?Take by mouth. ?  ?pyridOXINE 100 MG tablet ?Commonly known as: VITAMIN B-6 ?Take 100 mg by mouth once a week. ?  ?vitamin C 1000 MG tablet ?Take 1,000 mg by mouth daily. ?  ?VITAMIN D3 PO ?Take 5,000 mg by mouth once a week. ?  ?Zinc 100 MG Tabs ?Take by mouth once a week. ?  ? ?  ? ? ?  ? ?Review of Systems ? ?Weight history: ? ?Wt Readings from Last 3 Encounters:  ?05/09/21 242 lb 3.2 oz (109.9 kg)  ?04/27/20 231 lb (104.8 kg)  ?04/07/20 225 lb (102.1 kg)  ? ? ?     ?      ? Examination: ?  ? BP 130/90   Pulse 82   Ht 5\' 6"  (1.676 m)   Wt 242 lb 3.2 oz (109.9 kg)   SpO2 98%   BMI 39.09 kg/m?  ? ? ?THYROID: Not enlarged on either side ?Biceps reflexes show normal relaxation ?No edema ? ?Assessment: ? ?HYPOTHYROIDISM, mild with TSH 4.9 at baseline ? ?She apparently had symptomatic improvement with taking levothyroxine even with minimal hypothyroidism ?Also had mild thyroid enlargement at baseline ? ?She continues to feel fairly good on 100 mcg levothyroxine with normal TSH again ? ?PLAN:  ?She will continue on levothyroxine 100 mcg every morning as before ?She can follow-up annually unless she has any unusual fatigue in the interim ? ? ? ?05/09/2021, 10:04 AM  ? ? ? ?Note: This office note was prepared with 05/11/2021. Any transcriptional errors that result from this process are unintentional. ? ? ? ?

## 2021-07-10 ENCOUNTER — Other Ambulatory Visit: Payer: Self-pay | Admitting: Endocrinology

## 2021-07-10 DIAGNOSIS — E039 Hypothyroidism, unspecified: Secondary | ICD-10-CM

## 2021-08-04 ENCOUNTER — Other Ambulatory Visit: Payer: Self-pay | Admitting: Emergency Medicine

## 2021-08-04 DIAGNOSIS — Z8709 Personal history of other diseases of the respiratory system: Secondary | ICD-10-CM

## 2021-11-30 ENCOUNTER — Encounter: Payer: Self-pay | Admitting: Endocrinology

## 2022-04-23 ENCOUNTER — Telehealth: Payer: Self-pay

## 2022-04-23 NOTE — Telephone Encounter (Signed)
Pt called and requested a a total bill statement for 2023. Pt needs her total expenses for tax purposes.

## 2022-04-24 NOTE — Telephone Encounter (Signed)
Patient will call [facility] at [phone] to get 2023 itemized statement.

## 2022-05-07 ENCOUNTER — Other Ambulatory Visit: Payer: Self-pay

## 2022-05-07 DIAGNOSIS — E063 Autoimmune thyroiditis: Secondary | ICD-10-CM

## 2022-05-07 NOTE — Addendum Note (Signed)
Addended by: Cleda Mccreedy F on: 05/07/2022 10:12 AM   Modules accepted: Orders

## 2022-05-08 LAB — T4, FREE: Free T4: 1.47 ng/dL (ref 0.82–1.77)

## 2022-05-08 LAB — TSH: TSH: 2.66 u[IU]/mL (ref 0.450–4.500)

## 2022-05-10 ENCOUNTER — Encounter: Payer: Self-pay | Admitting: Endocrinology

## 2022-05-10 ENCOUNTER — Ambulatory Visit (INDEPENDENT_AMBULATORY_CARE_PROVIDER_SITE_OTHER): Payer: Self-pay | Admitting: Endocrinology

## 2022-05-10 VITALS — BP 130/80 | HR 76 | Ht 66.0 in | Wt 238.0 lb

## 2022-05-10 DIAGNOSIS — E039 Hypothyroidism, unspecified: Secondary | ICD-10-CM

## 2022-05-10 NOTE — Progress Notes (Signed)
Patient ID: Stacey Black, female   DOB: [birth year], 62 y.o.   MRN: [id number]            Referring Provider: Starr Lake  Reason for Appointment:  Hypothyroidism, follow-up visit    History of Present Illness:   Hypothyroidism was first diagnosed in 1989  Previous history or details are not available but likely the patient had baseline symptoms of fatigue She was also having more issues with her thyroid after her pregnancies        She had been treated previously with mostly brand-name Synthroid up to 200 mcg daily    She did not have any consistent follow-up for her thyroid treatment and stopped taking her Synthroid in 2013 Apparently did not feel any worse after stopping this  In 12/20 she  was having symptoms of fatigue which however has been going on for quite some time and she had blamed it on taking care of multiple family members as well as working She was having some issues with losing some hair and dry skin  Her PCP empirically started her on 100 mcg of levothyroxine when her TSH was 4.9 Free thyroxine index was normal at 1.9  Recent history:  With taking levothyroxine 100 mcg daily since 12/2018 she has had less fatigue, better skin and less hair loss She did have a small thyroid enlargement on the left at baseline  Generally has has had no symptoms of unusual fatigue No cold intolerance or skin or hair changes Weight is down slightly  She is quite regular with taking the levothyroxine regularly before eating in the morning  Her TSH is consistently normal and now 2.7         Patient's weight history is as follows:  Wt Readings from Last 3 Encounters:  05/10/22 238 lb (108 kg)  05/09/21 242 lb 3.2 oz (109.9 kg)  04/27/20 231 lb (104.8 kg)    Thyroid function results have been as follows:  Lab Results  Component Value Date   TSH 2.660 05/07/2022   TSH 2.980 04/25/2021   TSH 3.660 04/25/2020   TSH 2.100 10/23/2019   FREET4 1.47 05/07/2022    FREET4 1.40 04/25/2021   FREET4 1.34 04/25/2020   FREET4 1.24 10/23/2019   T3FREE 3.5 03/02/2019     Past Medical History:  Diagnosis Date   Allergy    Asthma    as a child   Thyroid disease     Past Surgical History:  Procedure Laterality Date   ABDOMINAL HYSTERECTOMY     2000    Family History  Problem Relation Age of Onset   Arthritis Mother    Asthma Mother    Vision loss Mother    Hypothyroidism Mother    Diabetes Neg Hx     Social History:  reports that she has never smoked. She has never used smokeless tobacco. She reports current alcohol use of about 3.0 standard drinks of alcohol per week. She reports that she does not use drugs.  Allergies:  Allergies  Allergen Reactions   Penicillins Swelling   Bee Venom Swelling    And animals, seasonal pollen    Allergies as of 05/10/2022       Reactions   Penicillins Swelling   Bee Venom Swelling   And animals, seasonal pollen        Medication List        Accurate as of May 10, 2022  8:44 AM. If you have any questions, ask your nurse  or doctor.          albuterol 108 (90 Base) MCG/ACT inhaler Commonly known as: VENTOLIN HFA Inhale 2 puffs into the lungs every 6 (six) hours as needed for wheezing or shortness of breath.   ciprofloxacin 0.3 % ophthalmic solution Commonly known as: CILOXAN SMARTSIG:In Eye(s)   DAYQUIL PO Take by mouth in the morning and at bedtime.   ibuprofen 600 MG tablet Commonly known as: ADVIL Take 1 tablet (600 mg total) by mouth every 6 (six) hours as needed.   levothyroxine 100 MCG tablet Commonly known as: SYNTHROID TAKE 1 TABLET BY MOUTH EVERY DAY   multivitamin tablet Take 1 tablet by mouth daily.   NYQUIL PO Take by mouth at bedtime.   OVER THE COUNTER MEDICATION   OVER THE COUNTER MEDICATION   PROBIOTIC DAILY PO Take by mouth.   pyridOXINE 100 MG tablet Commonly known as: VITAMIN B6 Take 100 mg by mouth once a week.   vitamin C 1000 MG  tablet Take 1,000 mg by mouth daily.   VITAMIN D3 PO Take 5,000 mg by mouth once a week.   Zinc 100 MG Tabs Take by mouth once a week.           Review of Systems  Weight history:  Wt Readings from Last 3 Encounters:  05/10/22 238 lb (108 kg)  05/09/21 242 lb 3.2 oz (109.9 kg)  04/27/20 231 lb (104.8 kg)                Examination:    BP 130/80 (BP Location: Left Arm, Patient Position: Sitting, Cuff Size: Normal)   Pulse 76   Ht  (1.676 m)   Wt 238 lb (108 kg)   SpO2 95%   BMI 38.41 kg/m    THYROID: Not palpable  Biceps reflexes show normal relaxation   Assessment:  HYPOTHYROIDISM, mild with TSH 4.9 at baseline  She reportedly had symptomatic improvement with taking levothyroxine even with minimal hypothyroidism Also had mild thyroid enlargement at baseline  She continues to subjectively well on 100 mcg levothyroxine with normal TSH as before  Preventive care: She will need to talk to her PCP including screening for diabetes  PLAN:  She will continue on levothyroxine 100 mcg daily, discussed timing of taking her medication  She can follow-up annually unless she has any unusual fatigue in the interim   Reather Littler 05/10/2022, 8:44 AM     Note: This office note was prepared with Dragon voice recognition system technology. Any transcriptional errors that result from this process are unintentional.

## 2022-07-09 ENCOUNTER — Telehealth: Payer: Self-pay

## 2022-07-09 NOTE — Telephone Encounter (Signed)
Patient called in to see if we could have her lab work from April sent over to [facility]. I assured patient that we would need her to come in and fill out a medical release form.  Patient stated that she was heading over to [facility] and would ask the provider to request the information if needed.

## 2022-07-27 ENCOUNTER — Other Ambulatory Visit: Payer: Self-pay | Admitting: Endocrinology

## 2022-07-27 DIAGNOSIS — E039 Hypothyroidism, unspecified: Secondary | ICD-10-CM

## 2022-08-20 ENCOUNTER — Other Ambulatory Visit: Payer: Self-pay | Admitting: Endocrinology

## 2022-08-20 DIAGNOSIS — E039 Hypothyroidism, unspecified: Secondary | ICD-10-CM

## 2023-11-04 DIAGNOSIS — G479 Sleep disorder, unspecified: Secondary | ICD-10-CM | POA: Diagnosis not present

## 2023-11-04 DIAGNOSIS — Z0000 Encounter for general adult medical examination without abnormal findings: Secondary | ICD-10-CM | POA: Diagnosis not present

## 2023-11-04 DIAGNOSIS — E039 Hypothyroidism, unspecified: Secondary | ICD-10-CM | POA: Diagnosis not present

## 2023-11-07 ENCOUNTER — Other Ambulatory Visit (HOSPITAL_BASED_OUTPATIENT_CLINIC_OR_DEPARTMENT_OTHER): Payer: Self-pay

## 2023-11-07 DIAGNOSIS — E039 Hypothyroidism, unspecified: Secondary | ICD-10-CM | POA: Diagnosis not present

## 2023-11-07 DIAGNOSIS — J4520 Mild intermittent asthma, uncomplicated: Secondary | ICD-10-CM | POA: Diagnosis not present

## 2023-11-07 DIAGNOSIS — E7800 Pure hypercholesterolemia, unspecified: Secondary | ICD-10-CM | POA: Diagnosis not present

## 2023-11-07 DIAGNOSIS — Z0000 Encounter for general adult medical examination without abnormal findings: Secondary | ICD-10-CM | POA: Diagnosis not present

## 2023-11-07 DIAGNOSIS — G479 Sleep disorder, unspecified: Secondary | ICD-10-CM | POA: Diagnosis not present

## 2023-11-14 ENCOUNTER — Other Ambulatory Visit: Payer: Self-pay

## 2023-11-14 ENCOUNTER — Ambulatory Visit (HOSPITAL_BASED_OUTPATIENT_CLINIC_OR_DEPARTMENT_OTHER)
Admission: RE | Admit: 2023-11-14 | Discharge: 2023-11-14 | Disposition: A | Discharge status: Home / Self Care | Payer: Self-pay | Source: Ambulatory Visit

## 2023-11-14 DIAGNOSIS — E7800 Pure hypercholesterolemia, unspecified: Secondary | ICD-10-CM | POA: Insufficient documentation

## 2023-11-14 DIAGNOSIS — Z780 Asymptomatic menopausal state: Secondary | ICD-10-CM | POA: Diagnosis not present

## 2023-11-14 DIAGNOSIS — Z1231 Encounter for screening mammogram for malignant neoplasm of breast: Secondary | ICD-10-CM

## 2023-11-15 ENCOUNTER — Ambulatory Visit
Admission: RE | Admit: 2023-11-15 | Discharge: 2023-11-15 | Disposition: A | Discharge status: Home / Self Care | Payer: Self-pay | Source: Ambulatory Visit

## 2023-11-15 DIAGNOSIS — Z1231 Encounter for screening mammogram for malignant neoplasm of breast: Secondary | ICD-10-CM | POA: Diagnosis not present

## 2023-11-20 ENCOUNTER — Other Ambulatory Visit: Payer: Self-pay

## 2023-11-20 DIAGNOSIS — R928 Other abnormal and inconclusive findings on diagnostic imaging of breast: Secondary | ICD-10-CM

## 2023-11-30 DIAGNOSIS — Z1211 Encounter for screening for malignant neoplasm of colon: Secondary | ICD-10-CM | POA: Diagnosis not present

## 2023-12-03 ENCOUNTER — Ambulatory Visit
Admission: RE | Admit: 2023-12-03 | Discharge: 2023-12-03 | Disposition: A | Discharge status: Home / Self Care | Source: Ambulatory Visit

## 2023-12-03 ENCOUNTER — Other Ambulatory Visit: Payer: Self-pay

## 2023-12-03 DIAGNOSIS — R928 Other abnormal and inconclusive findings on diagnostic imaging of breast: Secondary | ICD-10-CM

## 2023-12-03 DIAGNOSIS — N6325 Unspecified lump in the left breast, overlapping quadrants: Secondary | ICD-10-CM | POA: Diagnosis not present

## 2023-12-03 DIAGNOSIS — R921 Mammographic calcification found on diagnostic imaging of breast: Secondary | ICD-10-CM | POA: Diagnosis not present

## 2023-12-05 ENCOUNTER — Ambulatory Visit
Admission: RE | Admit: 2023-12-05 | Discharge: 2023-12-05 | Disposition: A | Discharge status: Home / Self Care | Source: Ambulatory Visit

## 2023-12-05 DIAGNOSIS — N6325 Unspecified lump in the left breast, overlapping quadrants: Secondary | ICD-10-CM | POA: Diagnosis not present

## 2023-12-05 HISTORY — PX: BREAST BIOPSY: SHX20

## 2023-12-06 LAB — SURGICAL PATHOLOGY

## 2023-12-07 LAB — COLOGUARD: COLOGUARD: POSITIVE — AB

## 2024-01-27 ENCOUNTER — Other Ambulatory Visit: Payer: Self-pay

## 2024-02-11 NOTE — Telephone Encounter (Signed)
 Error- please disregard
